# Patient Record
Sex: Male | Born: 1970 | Race: White | Hispanic: No | Marital: Married | Smoking: Former smoker
Health system: Southern US, Community
[De-identification: ages and names within clinical notes are randomized; demographics above are authoritative.]

## PROBLEM LIST (undated history)

## (undated) DIAGNOSIS — I10 Essential (primary) hypertension: Secondary | ICD-10-CM

## (undated) DIAGNOSIS — I214 Non-ST elevation (NSTEMI) myocardial infarction: Secondary | ICD-10-CM

## (undated) DIAGNOSIS — E785 Hyperlipidemia, unspecified: Secondary | ICD-10-CM

## (undated) DIAGNOSIS — F1010 Alcohol abuse, uncomplicated: Secondary | ICD-10-CM

## (undated) DIAGNOSIS — I2510 Atherosclerotic heart disease of native coronary artery without angina pectoris: Secondary | ICD-10-CM

## (undated) DIAGNOSIS — Z720 Tobacco use: Secondary | ICD-10-CM

## (undated) DIAGNOSIS — K219 Gastro-esophageal reflux disease without esophagitis: Secondary | ICD-10-CM

## (undated) HISTORY — DX: Hyperlipidemia, unspecified: E78.5

## (undated) HISTORY — DX: Essential (primary) hypertension: I10

## (undated) HISTORY — DX: Atherosclerotic heart disease of native coronary artery without angina pectoris: I25.10

## (undated) HISTORY — DX: Alcohol abuse, uncomplicated: F10.10

## (undated) HISTORY — PX: CORONARY STENT PLACEMENT: SHX1402

## (undated) HISTORY — PX: WISDOM TOOTH EXTRACTION: SHX21

## (undated) HISTORY — PX: TYMPANOSTOMY TUBE PLACEMENT: SHX32

## (undated) HISTORY — DX: Gastro-esophageal reflux disease without esophagitis: K21.9

---

## 2004-07-16 ENCOUNTER — Ambulatory Visit: Payer: Self-pay | Admitting: Family Medicine

## 2004-07-17 ENCOUNTER — Inpatient Hospital Stay (HOSPITAL_COMMUNITY): Admission: EM | Admit: 2004-07-17 | Discharge: 2004-07-19 | Payer: Self-pay | Admitting: Cardiovascular Disease

## 2004-07-17 ENCOUNTER — Ambulatory Visit: Payer: Self-pay | Admitting: Family Medicine

## 2004-07-17 ENCOUNTER — Ambulatory Visit: Payer: Self-pay | Admitting: Cardiovascular Disease

## 2004-07-23 ENCOUNTER — Ambulatory Visit: Payer: Self-pay | Admitting: Cardiology

## 2004-09-03 ENCOUNTER — Ambulatory Visit: Payer: Self-pay | Admitting: Cardiology

## 2004-10-07 ENCOUNTER — Ambulatory Visit: Payer: Self-pay | Admitting: Cardiology

## 2004-10-15 ENCOUNTER — Ambulatory Visit: Payer: Self-pay | Admitting: Cardiology

## 2009-07-31 ENCOUNTER — Encounter: Payer: Self-pay | Admitting: Adult Health

## 2009-08-02 ENCOUNTER — Encounter: Payer: Self-pay | Admitting: Adult Health

## 2009-08-08 ENCOUNTER — Ambulatory Visit: Payer: Self-pay | Admitting: Adult Health

## 2009-08-08 DIAGNOSIS — R1013 Epigastric pain: Secondary | ICD-10-CM | POA: Insufficient documentation

## 2009-08-08 DIAGNOSIS — G4730 Sleep apnea, unspecified: Secondary | ICD-10-CM | POA: Insufficient documentation

## 2009-08-10 ENCOUNTER — Encounter: Payer: Self-pay | Admitting: Cardiology

## 2009-08-10 ENCOUNTER — Ambulatory Visit (HOSPITAL_COMMUNITY): Admission: RE | Admit: 2009-08-10 | Discharge: 2009-08-10 | Payer: Self-pay | Admitting: Cardiology

## 2009-08-10 ENCOUNTER — Encounter (INDEPENDENT_AMBULATORY_CARE_PROVIDER_SITE_OTHER): Payer: Self-pay | Admitting: *Deleted

## 2009-08-10 ENCOUNTER — Ambulatory Visit: Payer: Self-pay | Admitting: Cardiology

## 2009-08-16 ENCOUNTER — Ambulatory Visit: Payer: Self-pay | Admitting: Cardiology

## 2009-08-16 DIAGNOSIS — R1084 Generalized abdominal pain: Secondary | ICD-10-CM | POA: Insufficient documentation

## 2009-09-13 ENCOUNTER — Encounter: Payer: Self-pay | Admitting: Adult Health

## 2010-09-06 ENCOUNTER — Encounter (INDEPENDENT_AMBULATORY_CARE_PROVIDER_SITE_OTHER): Payer: Self-pay | Admitting: *Deleted

## 2010-09-29 DIAGNOSIS — I214 Non-ST elevation (NSTEMI) myocardial infarction: Secondary | ICD-10-CM

## 2010-09-29 HISTORY — DX: Non-ST elevation (NSTEMI) myocardial infarction: I21.4

## 2010-10-29 NOTE — Letter (Signed)
Summary: Appointment - Reminder 2  [facility]. [address]   [place], [place] [address]   Phone: [phone]  Fax: [phone]     September 06, 2010 MRN: [id number]   Sean Rivas [address] [place], [place]  [address]   Dear [name],  Our records indicate that it is time to schedule a follow-up appointment.  KATHYRN LAWRENCE        recommended that you follow up with [place] in    11.2011 PAST DUE        . It is very important that we reach you to schedule this appointment. We look forward to participating in your health care needs. Please contact us at the number listed above at your earliest convenience to schedule your appointment.  If you are unable to make an appointment at this time, give [place] a call so we can update our records.     Sincerely,   Glass blower/designer

## 2011-02-14 NOTE — Cardiovascular Report (Signed)
Sean Rivas, Sean Rivas NO.:  [id number]   MEDICAL RECORD NO.:  [id number]          PATIENT TYPE:  INP   LOCATION:  6527                         FACILITY:  [facility]   PHYSICIAN:  Salvadore Farber, M.D. LHCDATE OF BIRTH:  09/19/1971   DATE OF PROCEDURE:  07/18/2004  DATE OF DISCHARGE:                              CARDIAC CATHETERIZATION   PROCEDURE:  Left heart catheterization, coronary angiography, left  ventriculography, drug eluting stent placement x 2 to the second obtuse  marginal.   INDICATIONS FOR PROCEDURE:  Sean Rivas is a 40 year old gentleman with no  prior history of cardiovascular disease.  Risk factors are markedly  positive, family history, ongoing tobacco abuse, and dyslipidemia.  He  presents with two weeks of chest discomfort occurring primarily with  exertion but culminating in rest pain for which he was admitted to [facility].  He ruled out for myocardial infarction by serial enzymes and  electrocardiograms.  He underwent Adenosine Cardiolite demonstrating  inferolateral ischemia with ejection fraction of 31%.  He was transferred  for diagnostic angiography with an eye to percutaneous revascularization.   PROCEDURE TECHNIQUE:  Informed consent was obtained.  Under 1% lidocaine  local anesthesia, a 6 French sheath was placed in the right femoral artery  using the modified Seldinger technique.  Diagnostic angiography and  ventriculography were performed using JL4, JR4, and pigtail catheters.  These images demonstrated serial severe stenoses within the first obtuse  marginal.  The decision was made for percutaneous intervention.   A double bolus Eptifibatide administered as was 600 mg oral Plavix.  Additional 50 mg per kg of heparin was given to achieve and maintain an ACT  of greater than 200 seconds.  A 6 French XP3.5 guide was advanced over the  wire and engaged in the ostium of the left main.  A Luge wire was advanced  to the distal  obtuse marginal without difficulty.  The more distal of the  two lesions was directly stented using a 2.5 by 13 mm Cypher deployed at 16  atmospheres.  The more proximal of the two lesions was then stented  using a  3 by 13 mm Cypher at 16 atmospheres.  The distal lesion was then post  dilated using a 3 by 12 mm Quantum at 18 atmospheres.  The proximal lesion  was then post dilated using a 3.5 by 12 mm Quantum Maverick at 16  atmospheres.  Final angiography demonstrated no residual stenosis in either  lesion and TIMI III flow to the distal vasculature.  The patient tolerated  the procedure well and was transferred to the holding room in stable  condition.   COMPLICATIONS:  None.   FINDINGS:  1.  Left ventricle:  108/18.  EF 60% without regional wall motion      abnormality.  2.  No aortic stenosis or mitral regurgitation.  3.  Left main:  Angiographically normal.  4.  LAD:  Moderate size vessel giving rise to two moderate size diagonal      branches.  There are mild luminal irregularities without significant      stenosis.  5.  Circumflex:  Moderate size vessel giving rise to a very high rising      first and large second diagonal branch.  The second diagonal had two      areas of 80% stenosis both treated with drug eluting stents to no      residual stenosis.  6.  Right coronary artery:  Moderate size dominant vessel.  There are minor      luminal irregularities along its course but no significant stenoses.   IMPRESSION/RECOMMENDATIONS:  Successful placement of two, non-overlapping,  drug eluting stents in the second obtuse marginal branch.  The patient will  be continued on Eptifibatide for 18 hours.  Plavix should be continued for  at least a year.  Full strength aspirin should be continued for at least a  year.  Smoking cessation is strongly advised.       WED/MEDQ  D:  07/18/2004  T:  07/18/2004  Job:  [id number]   cc:   Jonelle Sidle, M.D. [facility]   Selinda Flavin  [address]  [place]  [place] [address]  Fax: [phone]

## 2011-02-14 NOTE — Discharge Summary (Signed)
NAMEBERK, PILOT NO.:  [id number]   MEDICAL RECORD NO.:  [id number]          PATIENT TYPE:  INP   LOCATION:  6527                         FACILITY:  [facility]   PHYSICIAN:  Salvadore Farber, M.D. LHCDATE OF BIRTH:  April 18, 1971   DATE OF ADMISSION:  07/17/2004  DATE OF DISCHARGE:  07/18/2004                           DISCHARGE SUMMARY - REFERRING   DISCHARGE DIAGNOSES:  1.  Coronary artery disease, status post Sterling stent to the      circumflex/obtuse marginal.  2.  Tobacco abuse, smoking cessation counseling provided.  3.  Strong family history of early coronary artery disease.  4.  Hyperlipidemia, treated.   HOSPITAL COURSE:  Sean Rivas is a 40 year old male patient with a strong  family history of coronary artery disease.  His brother, who is only several  years older than this patient, also has known coronary artery disease and  has suffered a recent myocardial infarction.  He was admitted to [facility] with a 2-week history of constant burning in his left anterior  chest with occasional radiation to his left arm.  His troponins were  negative, but his CKs were elevated secondary to what was felt to be intense  weight-lifting workout.  EKG was essentially normal.  A stress Cardiolite  was performed and there was a lateral apical anteroseptal defect which was  felt to be ischemic in nature.  The patient was then transferred to [facility] for  cardiac catheterization on July 17, 2004.  He was found to have two 80%  sequential circumflex lesions and 2 drug-eluting stents were placed.  The  patient will need to remain on Plavix for greater than or equal to 1 year  and aspirin indefinitely.  He was counseled on smoking cessation and on  July 19, 2004, he was felt to be ready for discharge to home.   LABORATORY DATA:  Labs at that time revealed a BUN of 8, creatinine 1.1,  potassium 4; hemoglobin and hematocrit were normal.  His CK-MB was  negative.   DISCHARGE MEDICATIONS:  He is discharged to home on the following:  1.  Enteric-coated aspirin 325 mg a day.  2.  Plavix 75 mg a day.  3.  Wellbutrin 150 mg daily x3 days, then 1 p.o. b.i.d.  4.  Lipitor 80 mg p.o. nightly.  5.  Sublingual nitroglycerin p.r.n. chest pain.   ACTIVITY:  No straining and no lifting over 10 pounds for 1 week.  No  driving today; he may drive over the weekend.   DIET:  Remain on a low-fat diet.   WOUND CARE:  Clean over cath site with soap and water, no scrubbing.  Call  for questions or concerns at [phone].   FOLLOWUP:  He has an appointment with Suszanne Conners. Julious Oka. on August 02, 2004 at 11:30 a.m. at our [place] office.       LB/MEDQ  D:  07/19/2004  T:  07/19/2004  Job:  [id number]   cc:   [address], [place], [place] [address] [facility]   Selinda Flavin  [address]  [address], [address]  [place]  [place] [address]  Fax: [phone]

## 2011-06-19 ENCOUNTER — Encounter: Payer: Self-pay | Admitting: Adult Health

## 2011-06-19 ENCOUNTER — Ambulatory Visit (INDEPENDENT_AMBULATORY_CARE_PROVIDER_SITE_OTHER): Payer: 59 | Admitting: Adult Health

## 2011-06-19 DIAGNOSIS — I2510 Atherosclerotic heart disease of native coronary artery without angina pectoris: Secondary | ICD-10-CM

## 2011-06-19 DIAGNOSIS — R12 Heartburn: Secondary | ICD-10-CM

## 2011-06-19 DIAGNOSIS — I10 Essential (primary) hypertension: Secondary | ICD-10-CM

## 2011-06-19 MED ORDER — PANTOPRAZOLE SODIUM 40 MG PO TBEC: 40.0000 mg | DELAYED_RELEASE_TABLET | Freq: Two times a day (BID) | ORAL | Status: DC

## 2011-06-19 NOTE — Assessment & Plan Note (Signed)
He is complaining again of heartburn feelings that he experienced prior to stent placement in 2005.  He states that he is not having chest discomfort with exertion, but only at night when he lays down.  He has stopped taking PPI.  I have given him Rx for PPI 40 mg BID, he will have stress echo completed as well. He has a positive Murphy's sign and therefore will order GB ultrasound. Will follow-up for discussion after tests completed.

## 2011-06-19 NOTE — Patient Instructions (Signed)
**Note De-Identified Sean Rivas Obfuscation** Your physician has requested that you have a stress echocardiogram. For further information please visit [email or web address] Please follow instruction sheet as given.  Your physician recommends that you have a Gallbladder Ultrasound  Your physician recommends that you schedule a follow-up appointment in: after tests

## 2011-06-20 NOTE — Progress Notes (Signed)
HPI: Sean Rivas is a 40 y/o male patient of Dr. Dietrich Pates we are seeing after a 2 year hiatus for complaints of recurrent chest burning/heartburn symptoms.  He has a history of premature CAD at the age of 62, with DES to Cx and OM at that time, with symptoms of heartburn preceeding intervention.  He has had a follow-up stress scho in 2010 that was normal with no evidence of stress induced ischemia.    He is athletic, runs and lifts weights several times a week.  He has been experiencing recurrent heartburn at rest, but not with his exercise routine. He states it occurs a lot at night. He has become concerned because of the symptoms that lead to his PCI in 2007.  Otherwise he is doing well, without any associated complaints.  No Known Allergies  Current Outpatient Prescriptions  Medication Sig Dispense Refill  . aspirin 325 MG tablet Take 325 mg by mouth daily.        [name] gemfibrozil (LOPID) 600 MG tablet Take 600 mg by mouth 2 (two) times daily before a meal.        . lisinopril (PRINIVIL,ZESTRIL) 10 MG tablet Take 10 mg by mouth daily.        . simvastatin (ZOCOR) 80 MG tablet Take 80 mg by mouth at bedtime.        . nitroGLYCERIN (NITROLINGUAL) 0.4 MG/SPRAY spray Place 1 spray under the tongue every 5 (five) minutes as needed.        . pantoprazole (PROTONIX) 40 MG tablet Take 1 tablet (40 mg total) by mouth 2 (two) times daily.  60 tablet  3    Past Medical History  Diagnosis Date  . Coronary artery disease   . Hyperlipidemia   . Hypertension   . GERD (gastroesophageal reflux disease)     Past Surgical History  Procedure Date  . Cardiac catheterization 2005    DES to Circ and OM in 2005    ROS: PHYSICAL EXAM BP 125/88  Pulse 67  Resp 18  Ht 5\' 9"  (1.753 m)  Wt 206 lb 12.8 oz (93.804 kg)  BMI 30.54 kg/m2  SpO2 98% General: Well developed, well nourished, in no acute distress Head: Eyes PERRLA, No xanthomas.   Normal cephalic and atramatic  Lungs: Clear bilaterally to  auscultation and percussion. Heart: HRRR S1 S2, without MRG.  Pulses are 2+ & equal.            No carotid bruit. No JVD.  No abdominal bruits. No femoral bruits. Abdomen: Bowel sounds are positive, abdomen soft and non-tender without masses or                  Hernia's noted. Msk:  Back normal, normal gait. Normal strength and tone for age. Extremities: No clubbing, cyanosis or edema.  DP +1 Neuro: Alert and oriented X 3. Psych:  Good affect, responds appropriately    ASSESSMENT AND PLAN

## 2011-07-02 ENCOUNTER — Ambulatory Visit (HOSPITAL_COMMUNITY)
Admission: RE | Admit: 2011-07-02 | Discharge: 2011-07-02 | Disposition: A | Discharge status: Home / Self Care | Payer: 59 | Source: Ambulatory Visit | Attending: Adult Health | Admitting: Adult Health

## 2011-07-02 ENCOUNTER — Other Ambulatory Visit (HOSPITAL_COMMUNITY): Payer: 59

## 2011-07-02 DIAGNOSIS — R072 Precordial pain: Secondary | ICD-10-CM

## 2011-07-02 DIAGNOSIS — K7689 Other specified diseases of liver: Secondary | ICD-10-CM | POA: Insufficient documentation

## 2011-07-02 DIAGNOSIS — R12 Heartburn: Secondary | ICD-10-CM

## 2011-07-02 DIAGNOSIS — I2510 Atherosclerotic heart disease of native coronary artery without angina pectoris: Secondary | ICD-10-CM

## 2011-07-02 DIAGNOSIS — R079 Chest pain, unspecified: Secondary | ICD-10-CM | POA: Insufficient documentation

## 2011-07-02 DIAGNOSIS — R1013 Epigastric pain: Secondary | ICD-10-CM | POA: Insufficient documentation

## 2011-07-02 NOTE — Progress Notes (Signed)
*  PRELIMINARY RESULTS* Echocardiogram Echocardiogram Stress Test has been performed.  Sean Rivas 07/02/2011, 12:03 PM

## 2011-07-02 NOTE — Progress Notes (Signed)
Stress Lab Nurses Notes - [facility]  AMARU BURROUGHS 07/02/2011  Reason for doing test: CAD and Chest Pain  Type of test: Stress Echo  Nurse performing test: Parke Poisson, RN  Nuclear Medicine Tech: Not Applicable  Echo Tech: Karrie Doffing  MD performing test: R. Dietrich Pates  Family MD: Dimas Aguas  Test explained and consent signed: yes  IV started: No IV started  Symptoms: fatigue  Treatment/Intervention: None  Reason test stopped: fatigue and reached target HR  After recovery IV was: NA  Patient to return to Nuc. Med at : NA  Patient discharged: Home  Patient's Condition upon discharge was: stable  Comments: Peak BP during test 198/78 & HR 172. Recovery BP 138/80 & HR 85.  Symptoms resolved in recovery.  Erskine Speed T

## 2011-07-04 ENCOUNTER — Ambulatory Visit (INDEPENDENT_AMBULATORY_CARE_PROVIDER_SITE_OTHER): Payer: 59 | Admitting: Adult Health

## 2011-07-04 ENCOUNTER — Encounter: Payer: Self-pay | Admitting: Adult Health

## 2011-07-04 DIAGNOSIS — R1084 Generalized abdominal pain: Secondary | ICD-10-CM

## 2011-07-04 DIAGNOSIS — I2510 Atherosclerotic heart disease of native coronary artery without angina pectoris: Secondary | ICD-10-CM

## 2011-07-04 NOTE — Assessment & Plan Note (Signed)
Stress echo demonstrated normal.  No stress arrhythmias, or conduction abnormalities. There was no echocardiographic evidence for stress induced ischemia. This has been discussed with the patient with copy of this report given to him.  He is given reassurance.  If Dr. Dimas Aguas wishes to proceed with Testosterone injections, he is okay from cardiac standpoint.

## 2011-07-04 NOTE — Assessment & Plan Note (Signed)
Gallbladder ultrasound demonstrated normal gallbladder without stones, or dilation of the bile ducts. This was discussed with the patient and copies are provided for him to take to PCP.  He is advised to continue PPI (Dr. Gershon Cull) as directed as this has been helpful in alleviating symptoms.  HIDA scan can be completed at the discretion of PCP if he feels this is necessary.

## 2011-07-04 NOTE — Patient Instructions (Signed)
**Note De-identified Mckinzy Fuller Obfuscation** Your physician recommends that you continue on your current medications as directed. Please refer to the Current Medication list given to you today.  Your physician recommends that you schedule a follow-up appointment in: as needed  

## 2011-07-04 NOTE — Progress Notes (Signed)
HPI: [name] is a 40 y/o male patient of Dr. Dietrich Pates we are seeing after a 2 year hiatus for complaints of recurrent chest burning/heartburn symptoms.  He has a history of premature CAD at the age of 89, with DES to Cx and OM at that time, with symptoms of heartburn preceeding intervention.  He has had a follow-up stress scho in 2010 that was normal with no evidence of stress induced ischemia.    He is athletic, runs and lifts weights several times a week.  He has been experiencing recurrent heartburn at rest, but not with his exercise routine. He states it occurs a lot at night. He has become concerned because of the symptoms that lead to his PCI in 2007.  Otherwise he is doing well, without any associated complaints.  On last visit, I started him on PPI  BID and checked a gallbladder ultrasound and stress echo.  He is here for discussion of results. Since last visit he states after about a week on the PPI, symptoms have subsided for heart burn. No Known Allergies  Current Outpatient Prescriptions  Medication Sig Dispense Refill  . aspirin 325 MG tablet Take 325 mg by mouth daily.        [name] gemfibrozil (LOPID) 600 MG tablet Take 600 mg by mouth 2 (two) times daily before a meal.        . lisinopril (PRINIVIL,ZESTRIL) 10 MG tablet Take 10 mg by mouth daily.        . pantoprazole (PROTONIX) 40 MG tablet Take 1 tablet (40 mg total) by mouth 2 (two) times daily.  60 tablet  3  . simvastatin (ZOCOR) 80 MG tablet Take 80 mg by mouth at bedtime.        . nitroGLYCERIN (NITROLINGUAL) 0.4 MG/SPRAY spray Place 1 spray under the tongue every 5 (five) minutes as needed.          Past Medical History  Diagnosis Date  . Coronary artery disease   . Hyperlipidemia   . Hypertension   . GERD (gastroesophageal reflux disease)     Past Surgical History  Procedure Date  . Cardiac catheterization 2005    DES to Circ and OM in 2005    XBJ:YNWGNF of systems complete and found to be negative unless  listed above PHYSICAL EXAM BP 138/72  Pulse 66  Resp 18  Ht 5\' 9"  (1.753 m)  Wt 207 lb 14.4 oz (94.303 kg)  BMI 30.70 kg/m2  SpO2 98% General: Well developed, well nourished, in no acute distress Head: Eyes PERRLA, No xanthomas.   Normal cephalic and atramatic  Lungs: Clear bilaterally to auscultation and percussion. Heart: HRRR S1 S2, without MRG.  Pulses are 2+ & equal.            No carotid bruit. No JVD.  No abdominal bruits. No femoral bruits. Abdomen: Bowel sounds are positive, abdomen soft and non-tender without masses or                  Hernia's noted. Msk:  Back normal, normal gait. Normal strength and tone for age. Extremities: No clubbing, cyanosis or edema.  DP +1 Neuro: Alert and oriented X 3. Psych:  Good affect, responds appropriately    ASSESSMENT AND PLAN

## 2011-09-21 ENCOUNTER — Encounter (HOSPITAL_COMMUNITY): Payer: Self-pay | Admitting: Nurse Practitioner

## 2011-09-21 ENCOUNTER — Other Ambulatory Visit: Payer: Self-pay

## 2011-09-21 ENCOUNTER — Encounter (HOSPITAL_COMMUNITY)
Admission: EM | Disposition: A | Discharge status: Home / Self Care | Payer: Self-pay | Source: Ambulatory Visit | Attending: Cardiovascular Disease

## 2011-09-21 ENCOUNTER — Inpatient Hospital Stay (HOSPITAL_COMMUNITY)
Admission: EM | Admit: 2011-09-21 | Discharge: 2011-09-23 | DRG: 246 | Disposition: A | Discharge status: Home / Self Care | Payer: 59 | Source: Ambulatory Visit | Attending: Cardiovascular Disease | Admitting: Cardiovascular Disease

## 2011-09-21 DIAGNOSIS — I2510 Atherosclerotic heart disease of native coronary artery without angina pectoris: Secondary | ICD-10-CM

## 2011-09-21 DIAGNOSIS — F1010 Alcohol abuse, uncomplicated: Secondary | ICD-10-CM | POA: Diagnosis present

## 2011-09-21 DIAGNOSIS — F17200 Nicotine dependence, unspecified, uncomplicated: Secondary | ICD-10-CM | POA: Diagnosis present

## 2011-09-21 DIAGNOSIS — I214 Non-ST elevation (NSTEMI) myocardial infarction: Secondary | ICD-10-CM | POA: Diagnosis present

## 2011-09-21 DIAGNOSIS — Z720 Tobacco use: Secondary | ICD-10-CM | POA: Insufficient documentation

## 2011-09-21 DIAGNOSIS — Y831 Surgical operation with implant of artificial internal device as the cause of abnormal reaction of the patient, or of later complication, without mention of misadventure at the time of the procedure: Secondary | ICD-10-CM | POA: Diagnosis present

## 2011-09-21 DIAGNOSIS — Z23 Encounter for immunization: Secondary | ICD-10-CM

## 2011-09-21 DIAGNOSIS — K219 Gastro-esophageal reflux disease without esophagitis: Secondary | ICD-10-CM | POA: Insufficient documentation

## 2011-09-21 DIAGNOSIS — I498 Other specified cardiac arrhythmias: Secondary | ICD-10-CM | POA: Diagnosis not present

## 2011-09-21 DIAGNOSIS — Z7902 Long term (current) use of antithrombotics/antiplatelets: Secondary | ICD-10-CM

## 2011-09-21 DIAGNOSIS — I10 Essential (primary) hypertension: Secondary | ICD-10-CM | POA: Insufficient documentation

## 2011-09-21 DIAGNOSIS — Z7982 Long term (current) use of aspirin: Secondary | ICD-10-CM

## 2011-09-21 DIAGNOSIS — T82897A Other specified complication of cardiac prosthetic devices, implants and grafts, initial encounter: Principal | ICD-10-CM | POA: Diagnosis present

## 2011-09-21 DIAGNOSIS — E785 Hyperlipidemia, unspecified: Secondary | ICD-10-CM | POA: Insufficient documentation

## 2011-09-21 DIAGNOSIS — I2119 ST elevation (STEMI) myocardial infarction involving other coronary artery of inferior wall: Secondary | ICD-10-CM

## 2011-09-21 HISTORY — PX: PERCUTANEOUS CORONARY STENT INTERVENTION (PCI-S): SHX5485

## 2011-09-21 HISTORY — PX: LEFT HEART CATHETERIZATION WITH CORONARY ANGIOGRAM: SHX5451

## 2011-09-21 HISTORY — DX: Tobacco use: Z72.0

## 2011-09-21 LAB — COMPREHENSIVE METABOLIC PANEL
ALT: 44 U/L (ref 0–53)
AST: 29 U/L (ref 0–37)
Albumin: 4 g/dL (ref 3.5–5.2)
Alkaline Phosphatase: 56 U/L (ref 39–117)
BUN: 12 mg/dL (ref 6–23)
CO2: 23 mEq/L (ref 19–32)
Calcium: 9.4 mg/dL (ref 8.4–10.5)
Chloride: 99 mEq/L (ref 96–112)
Creatinine, Ser: 0.91 mg/dL (ref 0.50–1.35)
GFR calc Af Amer: >90 mL/min (ref >90)
GFR calc non Af Amer: >90 mL/min (ref >90)
Glucose, Bld: 117 mg/dL — ABNORMAL HIGH (ref 70–99)
Potassium: 3.3 mEq/L — ABNORMAL LOW (ref 3.5–5.1)
Sodium: 134 mEq/L — ABNORMAL LOW (ref 135–145)
Total Bilirubin: 0.3 mg/dL (ref 0.3–1.2)
Total Protein: 6.8 g/dL (ref 6.0–8.3)

## 2011-09-21 LAB — CARDIAC PANEL(CRET KIN+CKTOT+MB+TROPI)
CK, MB: 3.3 ng/mL (ref 0.3–4.0)
CK, MB: 50.4 ng/mL (ref 0.3–4.0)
Relative Index: 1.4 (ref 0.0–2.5)
Relative Index: 7.3 — ABNORMAL HIGH (ref 0.0–2.5)
Total CK: 236 U/L — ABNORMAL HIGH (ref 7–232)
Total CK: 694 U/L — ABNORMAL HIGH (ref 7–232)
Troponin I: <0.3 ng/mL (ref <0.30)
Troponin I: 7.15 ng/mL (ref <0.30)

## 2011-09-21 LAB — CBC
HCT: 40.7 % (ref 39.0–52.0)
Hemoglobin: 14.3 g/dL (ref 13.0–17.0)
MCH: 30.2 pg (ref 26.0–34.0)
MCHC: 35.1 g/dL (ref 30.0–36.0)
MCV: 86 fL (ref 78.0–100.0)
Platelets: 245 10*3/uL (ref 150–400)
RBC: 4.73 MIL/uL (ref 4.22–5.81)
RDW: 12.6 % (ref 11.5–15.5)
WBC: 9.5 10*3/uL (ref 4.0–10.5)

## 2011-09-21 LAB — HEMOGLOBIN A1C
Hgb A1c MFr Bld: 5.6 % (ref <5.7)
Mean Plasma Glucose: 114 mg/dL (ref <117)

## 2011-09-21 LAB — LIPID PANEL
Cholesterol: 264 mg/dL — ABNORMAL HIGH (ref 0–200)
HDL: 31 mg/dL — ABNORMAL LOW (ref >39)
LDL Cholesterol: 170 mg/dL — ABNORMAL HIGH (ref 0–99)
Total CHOL/HDL Ratio: 8.5 RATIO
Triglycerides: 314 mg/dL — ABNORMAL HIGH (ref <150)
VLDL: 63 mg/dL — ABNORMAL HIGH (ref 0–40)

## 2011-09-21 LAB — APTT: aPTT: 140 seconds — ABNORMAL HIGH (ref 24–37)

## 2011-09-21 LAB — PROTIME-INR
INR: 4.77 — ABNORMAL HIGH (ref 0.00–1.49)
Prothrombin Time: 45.4 seconds — ABNORMAL HIGH (ref 11.6–15.2)

## 2011-09-21 SURGERY — LEFT HEART CATHETERIZATION WITH CORONARY ANGIOGRAM
Anesthesia: LOCAL

## 2011-09-21 MED ORDER — HEPARIN (PORCINE) IN NACL 2-0.9 UNIT/ML-% IJ SOLN
INTRAMUSCULAR | Status: AC
  Filled 2011-09-21: qty 2000

## 2011-09-21 MED ORDER — LISINOPRIL 20 MG PO TABS
20.0000 mg | ORAL_TABLET | Freq: Every day | ORAL | Status: DC
  Administered 2011-09-21 – 2011-09-23 (×3): 20 mg via ORAL
  Filled 2011-09-21 (×3): qty 1

## 2011-09-21 MED ORDER — ACETAMINOPHEN 325 MG PO TABS: 650.0000 mg | ORAL_TABLET | ORAL | Status: DC | PRN

## 2011-09-21 MED ORDER — TESTOSTERONE 50 MG/5GM (1%) TD GEL
5.0000 g | Freq: Every day | TRANSDERMAL | Status: DC
  Administered 2011-09-22: 5 g via TRANSDERMAL
  Filled 2011-09-21: qty 5

## 2011-09-21 MED ORDER — GEMFIBROZIL 600 MG PO TABS
600.0000 mg | ORAL_TABLET | Freq: Two times a day (BID) | ORAL | Status: DC
  Administered 2011-09-21 – 2011-09-23 (×4): 600 mg via ORAL
  Filled 2011-09-21 (×6): qty 1

## 2011-09-21 MED ORDER — ADULT MULTIVITAMIN W/MINERALS CH
1.0000 | ORAL_TABLET | Freq: Every day | ORAL | Status: DC
  Administered 2011-09-21 – 2011-09-23 (×3): 1 via ORAL
  Filled 2011-09-21 (×3): qty 1

## 2011-09-21 MED ORDER — SODIUM CHLORIDE 0.9 % IV SOLN: 250.0000 mL | INTRAVENOUS | Status: DC | PRN

## 2011-09-21 MED ORDER — SODIUM CHLORIDE 0.9 % IV SOLN: 0.2500 mg/kg/h | INTRAVENOUS | Status: AC

## 2011-09-21 MED ORDER — ONDANSETRON HCL 4 MG/2ML IJ SOLN: 4.0000 mg | Freq: Four times a day (QID) | INTRAMUSCULAR | Status: DC | PRN

## 2011-09-21 MED ORDER — FOLIC ACID 1 MG PO TABS
1.0000 mg | ORAL_TABLET | Freq: Every day | ORAL | Status: DC
  Administered 2011-09-21 – 2011-09-23 (×3): 1 mg via ORAL
  Filled 2011-09-21 (×4): qty 1

## 2011-09-21 MED ORDER — BIVALIRUDIN 250 MG IV SOLR
INTRAVENOUS | Status: AC
  Filled 2011-09-21: qty 250

## 2011-09-21 MED ORDER — NITROGLYCERIN 0.4 MG/SPRAY TL SOLN: 1.0000 | Status: DC | PRN

## 2011-09-21 MED ORDER — ROSUVASTATIN CALCIUM 40 MG PO TABS
40.0000 mg | ORAL_TABLET | Freq: Every day | ORAL | Status: DC
  Administered 2011-09-21 – 2011-09-22 (×2): 40 mg via ORAL
  Filled 2011-09-21 (×3): qty 1

## 2011-09-21 MED ORDER — LIDOCAINE HCL (PF) 1 % IJ SOLN
INTRAMUSCULAR | Status: AC
  Filled 2011-09-21: qty 30

## 2011-09-21 MED ORDER — SODIUM CHLORIDE 0.9 % IV SOLN
INTRAVENOUS | Status: DC
  Administered 2011-09-21: 15:00:00 via INTRAVENOUS

## 2011-09-21 MED ORDER — NITROGLYCERIN 0.4 MG SL SUBL: 0.4000 mg | SUBLINGUAL_TABLET | SUBLINGUAL | Status: DC | PRN

## 2011-09-21 MED ORDER — ALPRAZOLAM 0.25 MG PO TABS: 0.2500 mg | ORAL_TABLET | Freq: Two times a day (BID) | ORAL | Status: DC | PRN

## 2011-09-21 MED ORDER — ATROPINE SULFATE 1 MG/ML IJ SOLN
INTRAMUSCULAR | Status: AC
  Filled 2011-09-21: qty 1

## 2011-09-21 MED ORDER — NITROGLYCERIN 0.2 MG/ML ON CALL CATH LAB
INTRAVENOUS | Status: AC
  Filled 2011-09-21: qty 1

## 2011-09-21 MED ORDER — SODIUM CHLORIDE 0.9 % IJ SOLN: 3.0000 mL | INTRAMUSCULAR | Status: DC | PRN

## 2011-09-21 MED ORDER — METOPROLOL TARTRATE 25 MG PO TABS
25.0000 mg | ORAL_TABLET | Freq: Two times a day (BID) | ORAL | Status: DC
  Administered 2011-09-21 – 2011-09-23 (×3): 25 mg via ORAL
  Filled 2011-09-21 (×5): qty 1

## 2011-09-21 MED ORDER — SODIUM CHLORIDE 0.9 % IJ SOLN
3.0000 mL | Freq: Two times a day (BID) | INTRAMUSCULAR | Status: DC
  Administered 2011-09-21 – 2011-09-23 (×4): 3 mL via INTRAVENOUS

## 2011-09-21 MED ORDER — PRASUGREL HCL 10 MG PO TABS
10.0000 mg | ORAL_TABLET | Freq: Every day | ORAL | Status: DC
  Administered 2011-09-22 – 2011-09-23 (×2): 10 mg via ORAL
  Filled 2011-09-21 (×2): qty 1

## 2011-09-21 MED ORDER — ASPIRIN EC 81 MG PO TBEC
81.0000 mg | DELAYED_RELEASE_TABLET | Freq: Every day | ORAL | Status: DC
  Administered 2011-09-22 – 2011-09-23 (×2): 81 mg via ORAL
  Filled 2011-09-21 (×2): qty 1

## 2011-09-21 MED ORDER — LORAZEPAM 1 MG PO TABS
1.0000 mg | ORAL_TABLET | Freq: Four times a day (QID) | ORAL | Status: DC | PRN
  Administered 2011-09-21 – 2011-09-22 (×2): 1 mg via ORAL
  Filled 2011-09-21 (×2): qty 1

## 2011-09-21 MED ORDER — MIDAZOLAM HCL 2 MG/2ML IJ SOLN
INTRAMUSCULAR | Status: AC
  Filled 2011-09-21: qty 2

## 2011-09-21 MED ORDER — PANTOPRAZOLE SODIUM 40 MG PO TBEC
40.0000 mg | DELAYED_RELEASE_TABLET | Freq: Two times a day (BID) | ORAL | Status: DC
  Administered 2011-09-21 – 2011-09-23 (×4): 40 mg via ORAL
  Filled 2011-09-21 (×5): qty 1

## 2011-09-21 MED ORDER — FENTANYL CITRATE 0.05 MG/ML IJ SOLN
INTRAMUSCULAR | Status: AC
  Filled 2011-09-21: qty 2

## 2011-09-21 MED ORDER — MORPHINE SULFATE 2 MG/ML IJ SOLN
2.0000 mg | INTRAMUSCULAR | Status: DC | PRN
  Administered 2011-09-21 (×3): 2 mg via INTRAVENOUS
  Filled 2011-09-21 (×3): qty 1

## 2011-09-21 MED ORDER — VITAMIN B-1 100 MG PO TABS
100.0000 mg | ORAL_TABLET | Freq: Every day | ORAL | Status: DC
  Administered 2011-09-21 – 2011-09-23 (×3): 100 mg via ORAL
  Filled 2011-09-21 (×3): qty 1

## 2011-09-21 MED ORDER — THIAMINE HCL 100 MG/ML IJ SOLN
100.0000 mg | Freq: Every day | INTRAMUSCULAR | Status: DC
  Filled 2011-09-21 (×2): qty 1

## 2011-09-21 MED ORDER — PRASUGREL HCL 10 MG PO TABS
ORAL_TABLET | ORAL | Status: AC
  Filled 2011-09-21: qty 6

## 2011-09-21 MED ORDER — ALLOPURINOL 100 MG PO TABS
100.0000 mg | ORAL_TABLET | Freq: Every day | ORAL | Status: DC
  Administered 2011-09-22 – 2011-09-23 (×2): 100 mg via ORAL
  Filled 2011-09-21 (×3): qty 1

## 2011-09-21 MED ORDER — PNEUMOCOCCAL VAC POLYVALENT 25 MCG/0.5ML IJ INJ
0.5000 mL | INJECTION | INTRAMUSCULAR | Status: AC
  Filled 2011-09-21: qty 0.5

## 2011-09-21 MED ORDER — ASPIRIN 81 MG PO CHEW: 81.0000 mg | CHEWABLE_TABLET | Freq: Every day | ORAL | Status: DC

## 2011-09-21 MED ORDER — ZOLPIDEM TARTRATE 5 MG PO TABS: 5.0000 mg | ORAL_TABLET | Freq: Every evening | ORAL | Status: DC | PRN

## 2011-09-21 MED ORDER — COLCHICINE 0.6 MG PO TABS
0.6000 mg | ORAL_TABLET | Freq: Every day | ORAL | Status: DC
Start: 2011-09-21 — End: 2011-09-23
  Administered 2011-09-22 – 2011-09-23 (×2): 0.6 mg via ORAL
  Filled 2011-09-21 (×3): qty 1

## 2011-09-21 MED ORDER — INFLUENZA VIRUS VACC SPLIT PF IM SUSP
0.5000 mL | INTRAMUSCULAR | Status: DC
  Filled 2011-09-21: qty 0.5

## 2011-09-21 MED ORDER — LORAZEPAM 2 MG/ML IJ SOLN
1.0000 mg | Freq: Four times a day (QID) | INTRAMUSCULAR | Status: DC | PRN
  Administered 2011-09-21 – 2011-09-22 (×2): 1 mg via INTRAVENOUS
  Filled 2011-09-21 (×2): qty 1

## 2011-09-21 NOTE — H&P (Signed)
I have seen and examined the patient. I agree with the above note. The patient presented with acute inferior myocardial infarction due to very late stent thrombosis. He underwent successful thrombectomy and drug-eluting stent placement. Post PCI care as outlined in my catheterization report.  Lorine Bears 09/21/2011 4:15 PM

## 2011-09-21 NOTE — Progress Notes (Signed)
09/21/11 1500  Clinical Encounter Type  Visited With Family;Patient;Patient and family together  Visit Type Spiritual support  Referral From Other (Comment) (STEMI team page)  Spiritual Encounters  Spiritual Needs Emotional  Stress Factors  Patient Stress Factors Major life changes (Needs to stop smoking)  Family Stress Factors Health changes    Chaplain's Note:  Responded to [facility] STEMI page.  Pt brought in via ems to cath lab.  Provided pastoral presence to staff and pt while pt being prepared for procedure.  Escorted pt wife and dtr from ED to cath waiting rm 3.  Provided pastoral presence and support to pt family.  Wife clearly concerned for husband, but received good care and support from family members.  Pt parents also arrived- mom is blind- and provided good comfort.  Checked on pt status for family. Escorted pt family to 2900 waiting after dr consult.  Pt wife and family thanked chaplain for presence and support.  Will follow-up as requested or needed.

## 2011-09-21 NOTE — Op Note (Addendum)
Cardiac Catheterization Procedure Note  Name: Sean Rivas MRN: [id number] DOB: December 30, 1970  Procedure: Left Heart Cath, Selective Coronary Angiography, LV angiography,  thrombectomy/drug-eluting Stent of OM 2.  Indication: Acute inferior myocardial infarction. This is a 40 year old man with known history of coronary artery disease status post angioplasty and Cypher drug-eluting stent placement of OM 2 in 2005. He had 2  nonoverlapping stents at that time. He has been doing reasonably well since then up until today when he started having chest pain while he was exercising which started around 11:30 this morning. EMS were called and ECG showed 2 mm of inferior ST elevation. The patient was transferred emergently for cardiac catheterization.   Medications:  Sedation:  2 mg IV Versed, 25 mcg IV Fentanyl    Diagnostic Procedure Details: The right groin was prepped, draped, and anesthetized with 1% lidocaine. Using the modified Seldinger technique, a 6 French sheath was introduced into the right femoral artery. Standard Judkins catheters were used for selective coronary angiography and left ventriculography. Catheter exchanges were performed over a wire.  The diagnostic procedure was well-tolerated without immediate complications.  Procedural Findings:  Hemodynamics: AO:  145/97   mmHg LV:  140/10    mmHg LVEDP: 17  mmHg  Coronary angiography: Coronary dominance: Right   Left Main:  The vessel is normal in size and free of significant disease.  Left Anterior Descending (LAD):  The vessel is normal in size without calcifications. There is tubular 30% stenosis in the midsegment. The rest of the vessel is free of significant disease.  1st diagonal (D1):  Large and branching. There is 30% disease proximally.  2nd diagonal (D2):  Small size and free of significant disease.  3rd diagonal (D3):  Very small in size.  Circumflex (LCx):  Normal in size and nondominant.  1st obtuse marginal:   Very small in size and free of significant disease.  2nd obtuse marginal:  Large size branch with a stent noted in the proximal segment which is patent with minimal restenosis. The area between the 2 stents is very hazy with possible plaque rupture. There is evidence of thrombosis at the proximal edge of the second stent. The rest of the vessel is free of significant disease.  3rd obtuse marginal:  Small in size and free of significant disease.   Ramus Intermedius:  Medium in size with 40-50% proximal disease.  Right Coronary Artery: Large in size and dominant. There is a 20% the tubular stenosis proximally. The midsegment has mild 20% disease. The rest of the vessel is free of significant disease.   posterior descending artery: Large in size and free of significant disease.  posterior lateral branch:  Normal in size and free of significant disease.  Left ventriculography: Left ventricular systolic function is normal , LVEF is estimated at 55 %, there is no significant mitral regurgitation  PCI Procedure Note:  Following the diagnostic procedure, the decision was made to proceed with PCI.  Weight-based bivalirudin was given for anticoagulation. Once a therapeutic ACT was achieved, a 6 [place] XB 3.5 guide catheter was inserted.  A intuition coronary guidewire was used to cross the lesion.  Thrombectomy was performed with 2 runs. White thrombus was retrieved.  The lesion was then stented with a 3.0 x 28 mm Promus drug-eluting stent.  The stent was postdilated with a 3.5 x 20 mm noncompliant balloon to 10 atmospheres distally and 12 atmospheres proximally..  Following PCI, there was 0% residual stenosis and TIMI-3 flow. Final angiography confirmed  an excellent result. The patient tolerated the PCI procedure well. There were no immediate procedural complications.  The patient was transferred to the post catheterization recovery area for further monitoring.  PCI Data: Vessel -OM 2/Segment - proximal and  mid Percent Stenosis (pre)  99% TIMI-flow 2 Stent 3.0 x 28 mm Promus drug-eluting stent postdilated with a 3.5 noncompliant balloon Percent Stenosis (post) 0 TIMI-flow (post) 3  Final Conclusions:  1. Acute inferior myocardial infarction likely due to very late stent thrombosis of the distal Cypher stent in OM 2 with significant disease in the gap between previous stents. Mild coronary artery disease otherwise elsewhere. 2. Normal LV systolic function and mildly elevated left ventricular end-diastolic pressure. 3. Successful thrombectomy and drug-eluting stent placement in OM 2. The stent basically partially covered the distal stent as well as the non-stented area between the 2 stents and also overlapped with the proximal stent.  Recommendations:  I recommend dual antiplatelet therapy indefinitely due to presentation with very late stent thrombosis. He should stay on Effient for at least one year which should be continued after that or switched to Plavix if needed. Aggressive treatment of risk factors recommended. Smoking cessation is strongly advised.  Lorine Bears MD, [facility] 09/21/2011, 2:37 PM

## 2011-09-21 NOTE — H&P (Signed)
Patient ID: Sean Rivas MRN: [id number], DOB/AGE: 1971/07/19   Admit date: 09/21/2011   Primary Physician: Criss Rosales, MD Primary Cardiologist: R. Rothbart  Pt. Profile:   41 y/o male w/ prior h/o CAD s/p DES x 2 to the OM2 in 2005 who presents with inf stemi and late stent thrombosis req thrombectomy and repeat stenting.  Problem List: Past Medical History  Diagnosis Date  . Coronary artery disease     A.  07/18/2004 - PCI/DES OM2: 2.5x13 Cypher DES Dist/3.0x13 Cypher Prox;   B.  06/2009 & 06/2011- NL Stress Echo EF 70-75%;  C. 09/21/2011 s/p STEMI 2/2 late stent thrombosis -  thrombectomy and PCI/DES 3.0 x 28 mm Promus  . Hyperlipidemia   . Hypertension   . GERD (gastroesophageal reflux disease)   . Tobacco abuse     24 pack year - 1ppd as of 09/21/2011     Allergies: No Known Allergies  HPI:   40 y/o male with the above problem list.  He is s/p DES of the OM2 in 2005.  This past fall, pt developed intermittent indigestion-like Ss and underwent Stress Echo showing NL EF and NL Wall motion - no evidence of ischemia.  Following this, he did well but a few weeks ago, while running on the treadmill, he developed sscp with diaph/dyspnea - took a ntg and Ss resolved within 15 mins.  He didn't think too much of it and has cont to exercise regularly.  This am, after running 3 miles on the treadmill, he went upstairs to shower, and while showering developed severe sscp/dyspnea/diaphoresis.  He took a NTG w/o relief then called EMS.  He was noted to have inferior ST elevation and a code Stemi was called.  Pt cont to c/o c/p upon arrival to [facility] and underwent emergent catheterization showing stent thrombosis w/in the previously placed OM2 stents.  Pt was treated w/ thrombectomy followed by DES as outlined above.  He is currently resting quietly in the CCU c/o minor 1/10 discomfort in his chest.   Home Medications Medications Prior to Admission  Medication Dose Route Frequency  Provider Last Rate Last Dose  . 0.9 %  sodium chloride infusion   Intravenous Continuous Iran Ouch, MD 100 mL/hr at 09/21/11 1524    . acetaminophen (TYLENOL) tablet 650 mg  650 mg Oral Q4H PRN Iran Ouch, MD      . aspirin chewable tablet 81 mg  81 mg Oral Daily Iran Ouch, MD      . bivalirudin (ANGIOMAX) 250 MG injection           . bivalirudin (ANGIOMAX) 5 mg/mL in sodium chloride 0.9 % 50 mL infusion  0.25 mg/kg/hr Intravenous Continuous Iran Ouch, MD 4.7 mL/hr at 09/21/11 1524 0.253 mg/kg/hr at 09/21/11 1524  . fentaNYL (SUBLIMAZE) 0.05 MG/ML injection           . gemfibrozil (LOPID) tablet 600 mg  600 mg Oral BID AC Iran Ouch, MD      . heparin 2-0.9 UNIT/ML-% infusion           . lidocaine (XYLOCAINE) 1 % injection           . lisinopril (PRINIVIL,ZESTRIL) tablet 20 mg  20 mg Oral Daily Iran Ouch, MD      . metoprolol tartrate (LOPRESSOR) tablet 25 mg  25 mg Oral BID Iran Ouch, MD      . midazolam (VERSED) 2 MG/2ML injection           .  morphine 2 MG/ML injection 2 mg  2 mg Intravenous Q1H PRN Iran Ouch, MD      . nitroGLYCERIN (NITROSTAT) SL tablet 0.4 mg  0.4 mg Sublingual Q5 Min x 3 PRN Iran Ouch, MD      . nitroGLYCERIN (NTG ON-CALL) 0.2 mg/mL injection           . ondansetron (ZOFRAN) injection 4 mg  4 mg Intravenous Q6H PRN Iran Ouch, MD      . pantoprazole (PROTONIX) EC tablet 40 mg  40 mg Oral BID WC Iran Ouch, MD      . prasugrel (EFFIENT) 10 MG tablet           . prasugrel (EFFIENT) tablet 10 mg  10 mg Oral Daily Iran Ouch, MD      . rosuvastatin (CRESTOR) tablet 40 mg  40 mg Oral q1800 Iran Ouch, MD      . zolpidem (AMBIEN) tablet 5 mg  5 mg Oral QHS PRN Iran Ouch, MD      . DISCONTD: nitroGLYCERIN (NITROLINGUAL) 0.4 MG/SPRAY spray 1 spray  1 spray Sublingual Q5 min PRN Iran Ouch, MD       Medications Prior to Admission  Medication Sig Dispense Refill  . gemfibrozil  (LOPID) 600 MG tablet Take 600 mg by mouth 2 (two) times daily before a meal.        . nitroGLYCERIN (NITROLINGUAL) 0.4 MG/SPRAY spray Place 1 spray under the tongue every 5 (five) minutes as needed.        . pantoprazole (PROTONIX) 40 MG tablet Take 1 tablet (40 mg total) by mouth 2 (two) times daily.  60 tablet  3     Family History  Problem Relation Age of Onset  . Coronary artery disease Brother 58    s/p stenting  . Heart failure Brother 36    s/p ICD  . Coronary artery disease Mother 14    deceased @ 33     History   Social History  . Marital Status: Married    Spouse Name: N/A    Number of Children: N/A  . Years of Education: N/A   Occupational History  . 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 Smoking status: Current Everyday Smoker -- 1.0 packs/day for 24 years    Types: Cigarettes  . Smokeless tobacco: Never Used  . Alcohol Use: 14.4 oz/week    24 Cans of beer per week  . Drug Use: No  . Sexually Active: Yes   Other Topics Concern  . Not on file   Social History Narrative   Lives in [place] with wife.  Exercises most days of the week - runs/lift weights.     Review of Systems: General: negative for chills, fever, night sweats or weight changes.  Cardiovascular: +++ for chest pain and dyspnea prompting admission.  No edema, orthopnea, palpitations, paroxysmal nocturnal dyspnea. Dermatological: negative for rash Respiratory: negative for cough or wheezing Urologic: negative for hematuria Abdominal: negative for nausea, vomiting, diarrhea, bright red blood per rectum, melena, or hematemesis Neurologic: negative for visual changes, syncope, or dizziness All other systems reviewed and are otherwise negative except as noted above.  Physical Exam: Blood pressure 147/95, pulse 62, temperature 98.7 F (37.1 C), temperature source Oral, resp. rate 12, height 5\' 9"  (1.753 m), weight 205 lb 0.4 oz (93 kg), SpO2 99.00%.  General: Well  developed, well nourished, in no acute distress. Head:  Normocephalic, atraumatic, sclera non-icteric, no xanthomas, nares are without discharge.  Neck: Supple without bruits or JVD. Lungs:  Resp regular and unlabored, CTA. Heart: RRR no s3, s4, or murmurs. Abdomen: Soft, non-tender, non-distended, BS + x 4.  Msk:  Strength and tone appears normal for age. Extremities: No clubbing, cyanosis or edema. DP/PT/Radials 2+ and equal bilaterally. Neuro: Alert and oriented X 3. Moves all extremities spontaneously. Psych: Normal affect.   Labs:   Results for orders placed during the hospital encounter of 09/21/11 (from the past 72 hour(s))  CBC     Status: Normal   Collection Time   09/21/11  1:58 PM      Component Value Range Comment   WBC 9.5  4.0 - 10.5 (K/uL)    RBC 4.73  4.22 - 5.81 (MIL/uL)    Hemoglobin 14.3  13.0 - 17.0 (g/dL)    HCT 40.9  81.1 - 91.4 (%)    MCV 86.0  78.0 - 100.0 (fL)    MCH 30.2  26.0 - 34.0 (pg)    MCHC 35.1  30.0 - 36.0 (g/dL)    RDW 78.2  95.6 - 21.3 (%)    Platelets 245  150 - 400 (K/uL)   COMPREHENSIVE METABOLIC PANEL     Status: Abnormal   Collection Time   09/21/11  1:58 PM      Component Value Range Comment   Sodium 134 (*) 135 - 145 (mEq/L)    Potassium 3.3 (*) 3.5 - 5.1 (mEq/L)    Chloride 99  96 - 112 (mEq/L)    CO2 23  19 - 32 (mEq/L)    Glucose, Bld 117 (*) 70 - 99 (mg/dL)    BUN 12  6 - 23 (mg/dL)    Creatinine, Ser 0.86  0.50 - 1.35 (mg/dL)    Calcium 9.4  8.4 - 10.5 (mg/dL)    Total Protein 6.8  6.0 - 8.3 (g/dL)    Albumin 4.0  3.5 - 5.2 (g/dL)    AST 29  0 - 37 (U/L)    ALT 44  0 - 53 (U/L)    Alkaline Phosphatase 56  39 - 117 (U/L)    Total Bilirubin 0.3  0.3 - 1.2 (mg/dL)    GFR calc non Af Amer >90  >90 (mL/min)    GFR calc Af Amer >90  >90 (mL/min)   LIPID PANEL     Status: Abnormal   Collection Time   09/21/11  1:58 PM      Component Value Range Comment   Cholesterol 264 (*) 0 - 200 (mg/dL)    Triglycerides 578 (*) <150  (mg/dL)    HDL 31 (*) >46 (mg/dL)    Total CHOL/HDL Ratio 8.5      VLDL 63 (*) 0 - 40 (mg/dL)    LDL Cholesterol 962 (*) 0 - 99 (mg/dL)   PROTIME-INR     Status: Abnormal   Collection Time   09/21/11  1:58 PM      Component Value Range Comment   Prothrombin Time 45.4 (*) 11.6 - 15.2 (seconds)    INR 4.77 (*) 0.00 - 1.49    APTT     Status: Abnormal   Collection Time   09/21/11  1:58 PM      Component Value Range Comment   aPTT 140 (*) 24 - 37 (seconds)   CARDIAC PANEL(CRET KIN+CKTOT+MB+TROPI)     Status: Abnormal   Collection Time   09/21/11  1:59 PM  Component Value Range Comment   Total CK 236 (*) 7 - 232 (U/L)    CK, MB 3.3  0.3 - 4.0 (ng/mL)    Troponin I <0.30  <0.30 (ng/mL)    Relative Index 1.4  0.0 - 2.5       Radiology/Studies: No results found.  EKG:  RSR, inf ST elevation.  ASSESSMENT AND PLAN:   1.  Acute Inferior STEMI/CAD/late stent thrombosis:  S/p emergent cath and PCI of OM2.  Currently c/o mild chest discomfort.  Will cycle CE.  Loaded with effient.  Cont asa.  BB, Statin added.  Eventual cardiac Rehab.  2.  Tob Abuse:  Cessation advised.  Will need formal counseling - will order.  3.  HL:  High dose statin added.  On Lopid @ home.  4. ETOH abuse:  Drinks at least a case of beer/week.  Will add prn ativan.  Watch for DT's.  Signed, Nicolasa Ducking, NP 09/21/2011, 3:48 PM

## 2011-09-21 NOTE — Progress Notes (Signed)
During shift change report was given at the bed side and day nurse had just finished holding right groin pressure post sheath removal and the sight looked good for about 30 seconds and then started to re bleed. Night nurse held pressure for 20 more minutes. Pressure held till 66. Pressure dressing applied and patient was given safety instructions for post sheath care. Wife is at bedside. Site has been checked every 15 minutes and continues to look normal. Right groin is a level 0.

## 2011-09-22 ENCOUNTER — Other Ambulatory Visit: Payer: Self-pay

## 2011-09-22 DIAGNOSIS — I214 Non-ST elevation (NSTEMI) myocardial infarction: Secondary | ICD-10-CM

## 2011-09-22 LAB — LIPID PANEL
Cholesterol: 246 mg/dL — ABNORMAL HIGH (ref 0–200)
HDL: 27 mg/dL — ABNORMAL LOW (ref >39)
LDL Cholesterol: UNDETERMINED mg/dL (ref 0–99)
Triglycerides: 425 mg/dL — ABNORMAL HIGH (ref <150)

## 2011-09-22 LAB — BASIC METABOLIC PANEL
BUN: 10 mg/dL (ref 6–23)
Chloride: 101 mEq/L (ref 96–112)
GFR calc Af Amer: >90 mL/min (ref >90)
Potassium: 3.7 mEq/L (ref 3.5–5.1)
Sodium: 134 mEq/L — ABNORMAL LOW (ref 135–145)

## 2011-09-22 LAB — POCT I-STAT 3, ART BLOOD GAS (G3+)
Acid-base deficit: 2 mmol/L (ref 0.0–2.0)
Bicarbonate: 22.4 mEq/L (ref 20.0–24.0)
O2 Saturation: 98 %
TCO2: 24 mmol/L (ref 0–100)

## 2011-09-22 LAB — CARDIAC PANEL(CRET KIN+CKTOT+MB+TROPI): Total CK: 1058 U/L — ABNORMAL HIGH (ref 7–232)

## 2011-09-22 LAB — CBC
HCT: 40.7 % (ref 39.0–52.0)
Hemoglobin: 13.7 g/dL (ref 13.0–17.0)
RDW: 12.9 % (ref 11.5–15.5)
WBC: 11.1 10*3/uL — ABNORMAL HIGH (ref 4.0–10.5)

## 2011-09-22 MED ORDER — INFLUENZA VIRUS VACC SPLIT PF IM SUSP
0.5000 mL | INTRAMUSCULAR | Status: AC
  Administered 2011-09-23: 0.5 mL via INTRAMUSCULAR
  Filled 2011-09-22: qty 0.5

## 2011-09-22 MED FILL — Dextrose Inj 5%: INTRAVENOUS | Qty: 50 | Status: AC

## 2011-09-22 NOTE — Progress Notes (Signed)
Patient ID: SEM MCCAUGHEY, male   DOB: 04/14/1971, 40 y.o.   MRN: [id number] Subjective:  No chest pain or sob.  Objective:  Vital Signs in the last 24 hours: Temp:  [97.5 F (36.4 C)-98.7 F (37.1 C)] 98.6 F (37 C) (12/24 0800) Pulse Rate:  [49-84] 65  (12/24 0934) Resp:  [11-24] 17  (12/24 0800) BP: (104-150)/(64-101) 127/64 mmHg (12/24 0900) SpO2:  [95 %-100 %] 96 % (12/24 0900) Weight:  [93 kg (205 lb 0.4 oz)-95 kg (209 lb 7 oz)] 209 lb 7 oz (95 kg) (12/24 0600)  Intake/Output from previous day: 12/23 0701 - 12/24 0700 In: 2440 [P.O.:480; I.V.:1960] Out: 3170 [Urine:3170] Intake/Output from this shift: Total I/O In: 360 [P.O.:360] Out: -   Physical Exam: Well appearing young man, NAD HEENT: Unremarkable Neck:  No JVD, no thyromegally Lymphatics:  No adenopathy Back:  No CVA tenderness Lungs:  Clear with no wheezes HEART:  Regular rate rhythm, no murmurs, no rubs, no clicks Abd:  Flat, positive bowel sounds, no organomegally, no rebound, no guarding Ext:  2 plus pulses, no edema, no cyanosis, no clubbing, no hematoma. Skin:  No rashes no nodules Neuro:  CN II through XII intact, motor grossly intact  Lab Results:  Basename 09/22/11 0002 09/21/11 1358  WBC 11.1* 9.5  HGB 13.7 14.3  PLT 246 245    Basename 09/22/11 0002 09/21/11 1358  NA 134* 134*  K 3.7 3.3*  CL 101 99  CO2 23 23  GLUCOSE 102* 117*  BUN 10 12  CREATININE 0.88 0.91    Basename 09/22/11 0010 09/21/11 2025  TROPONINI 11.02* 7.15*   Hepatic Function Panel  Basename 09/21/11 1358  PROT 6.8  ALBUMIN 4.0  AST 29  ALT 44  ALKPHOS 56  BILITOT 0.3  BILIDIR --  IBILI --    Basename 09/22/11 0002  CHOL 246*   No results found for this basename: PROTIME in the last 72 hours  Imaging: No results found.  Cardiac Studies: Tele - NSR with NSVT Assessment/Plan:  1. NSTEMI - He is s/p PCI. He is doing well. He will continue his current meds. If he remains uncomplicated, would  anticipate discharge on 12/25. 2. H/o ETOH abuse - will continue benzo's to help prevent DT's. 3.Tobacco abuse - will need smoking cessation consult.  LOS: 1 day    Lewayne Bunting 09/22/2011, 10:15 AM

## 2011-09-22 NOTE — Progress Notes (Signed)
Seen by Dr. Ladona Ridgel today. S/p PCI to very late stent thrombosis. Evaluated by myself. He denies chest pain, diaphoresis, shortness of breath, palpitations, lightheadedness, n/v and abdominal pain. Ambulating well in the hall with assistance. On ASA/Effient, BB, Statin, NTG; Ativan PRN due to history of alcohol abuse. Discharge anticipated for tomorrow per today's progress note. Stable, in good condition. Will transfer to telemetry. Plan to continue current meds, cardiac rehab and undergo tobacco cessation.   Jacqulyn Bath, PA-C 09/22/2011 4:08 PM

## 2011-09-22 NOTE — Discharge Summary (Signed)
Discharge Summary   Patient ID: Sean Rivas,  MRN: [id number], DOB/AGE: 04-Mar-1971 40 y.o.  Admit date: 09/21/2011 Discharge date: 09/22/2011  Discharge Diagnoses Principal Problem:  *ST elevation myocardial infarction (STEMI) of inferior wall Active Problems:  GERD (gastroesophageal reflux disease)  Hypertension  Hyperlipidemia  Coronary artery disease  Tobacco abuse   Allergies No Known Allergies  Procedures  Cardiac catheterization Procedural Findings:  Hemodynamics:  AO: 145/97 mmHg  LV: 140/10 mmHg  LVEDP: 17 mmHg  Coronary angiography:  Coronary dominance: Right  Left Main: The vessel is normal in size and free of significant disease.  Left Anterior Descending (LAD): The vessel is normal in size without calcifications. There is tubular 30% stenosis in the midsegment. The rest of the vessel is free of significant disease.  1st diagonal (D1): Large and branching. There is 30% disease proximally.  2nd diagonal (D2): Small size and free of significant disease.  3rd diagonal (D3): Very small in size.  Circumflex (LCx): Normal in size and nondominant.  1st obtuse marginal: Very small in size and free of significant disease.  2nd obtuse marginal: Large size branch with a stent noted in the proximal segment which is patent with minimal restenosis. The area between the 2 stents is very hazy with possible plaque rupture. There is evidence of thrombosis at the proximal edge of the second stent. The rest of the vessel is free of significant disease.  3rd obtuse marginal: Small in size and free of significant disease.  Ramus Intermedius: Medium in size with 40-50% proximal disease.  Right Coronary Artery: Large in size and dominant. There is a 20% the tubular stenosis proximally. The midsegment has mild 20% disease. The rest of the vessel is free of significant disease.  posterior descending artery: Large in size and free of significant disease.  posterior lateral branch:  Normal in size and free of significant disease. Left ventriculography: Left ventricular systolic function is normal , LVEF is estimated at 55 %, there is no significant mitral regurgitation  PCI Procedure Note: Following the diagnostic procedure, the decision was made to proceed with PCI. Weight-based bivalirudin was given for anticoagulation. Once a therapeutic ACT was achieved, a 6 [place] XB 3.5 guide catheter was inserted. A intuition coronary guidewire was used to cross the lesion. Thrombectomy was performed with 2 runs. White thrombus was retrieved. The lesion was then stented with a 3.0 x 28 mm Promus drug-eluting stent. The stent was postdilated with a 3.5 x 20 mm noncompliant balloon to 10 atmospheres distally and 12 atmospheres proximally.. Following PCI, there was 0% residual stenosis and TIMI-3 flow. Final angiography confirmed an excellent result. The patient tolerated the PCI procedure well. There were no immediate procedural complications. The patient was transferred to the post catheterization recovery area for further monitoring.  PCI Data:  Vessel -OM 2/Segment - proximal and mid  Percent Stenosis (pre) 99%  TIMI-flow 2  Stent 3.0 x 28 mm Promus drug-eluting stent postdilated with a 3.5 noncompliant balloon  Percent Stenosis (post) 0  TIMI-flow (post) 3  Final Conclusions:  1. Acute inferior myocardial infarction likely due to very late stent thrombosis of the distal Cypher stent in OM 2 with significant disease in the gap between previous stents. Mild coronary artery disease otherwise elsewhere.  2. Normal LV systolic function and mildly elevated left ventricular end-diastolic pressure.  3. Successful thrombectomy and drug-eluting stent placement in OM 2. The stent basically partially covered the distal stent as well as the non-stented area between the  2 stents and also overlapped with the proximal stent.  Recommendations:  I recommend dual antiplatelet therapy indefinitely due to  presentation with very late stent thrombosis. He should stay on Effient for at least one year which should be continued after that or switched to Plavix if needed. Aggressive treatment of risk factors recommended. Smoking cessation is strongly advised.  History of Present Illness  Sean Rivas 40 year old Caucasian male past medical history significant for CAD (status post DES x2 to distal and proximal OM 2, respectively in 2005), hypertension, hyperlipidemia, tobacco and alcohol abuse who underwent emergent PCI status post acute inferior STEMI on 10/23.  In the fall, patient developed what he thought to be indigestion-like symptoms and underwent stress echocardiogram revealing normal EF without wall motion abnormalities or evidence of ischemia 10/12. He was able to return to his usual activities and exercise with one episode of substernal chest pain relieved with NTG x1. He continued to exercise without incident. The morning of 12/23 after running on the treadmill he developed severe substernal chest pain with associated dyspnea and diaphoresis. NTG x1 provided no relief. He was noted to have inferior ST elevation and code STEMI was called.  Hospital Course   He was informed, consented, agreed and tolerated the procedure well which elicited the above findings including acute inferior myocardial infarction likely due to very late stent thrombosis of distal Cypher OM 2 stent with subsequent successful thrombectomy and DES placement to area; normal LV systolic function and mildly elevated LVEDP.  Today, he is relatively asymptomatic denying chest pain and shortness of breath. He has had no acute events overnight. Asymptomptomatic nonsustained ventricular tachycardia noted on telemetry. On 12/24, he was stable, in good condition and transferred to telemetry floor.  Of note, patient with history of alcohol abuse. He was started and continued on prophylactic Ativan PRN for the prevention of DT. Smoking  cessation consultation performed. He will continue on aspirin and Effient for at least one year; however, will need to be on DAPT (be it ASA/Effient or ASA/Plavix) indefinitely secondary to very late stent thrombosis.  Discharge Vitals:  Blood pressure 111/62, pulse 69, temperature 97.5 F (36.4 C), temperature source Oral, resp. rate 17, height 5\' 9"  (1.753 m), weight 95 kg (209 lb 7 oz), SpO2 97.00%.   Weight change:   Labs: Recent Labs  Basename 09/22/11 0002 09/21/11 1358   WBC 11.1* 9.5   HGB 13.7 14.3   HCT 40.7 40.7   MCV 87.9 86.0   PLT 246 245    Lab 09/22/11 0002 09/21/11 1358  NA 134* 134*  K 3.7 3.3*  CL 101 99  CO2 23 23  BUN 10 12  CREATININE 0.88 0.91  CALCIUM 9.1 9.4  PROT -- 6.8  BILITOT -- 0.3  ALKPHOS -- 56  ALT -- 44  AST -- 29  AMYLASE -- --  LIPASE -- --  GLUCOSE 102* 117*   Recent Labs  Basename 09/21/11 1358   HGBA1C 5.6   Recent Labs  Basename 09/22/11 0010 09/21/11 2025 09/21/11 1359   CKTOTAL 1058* 694* 236*   CKMB 81.4* 50.4* 3.3   CKMBINDEX -- -- --   TROPONINI 11.02* 7.15* <0.30   No components found with this basename: POCBNP Recent Labs  Basename 09/22/11 0002   CHOL 246*   HDL 27*   LDLCALC UNABLE TO CALCULATE IF TRIGLYCERIDE OVER 400 mg/dL   TRIG 578*   CHOLHDL 9.1   LDLDIRECT --    Disposition: stable, in good condition,  will be discharged home today   Discharge Medications:  Current Discharge Medication List    CONTINUE these medications which have NOT CHANGED   Details  allopurinol (ZYLOPRIM) 100 MG tablet Take 100 mg by mouth daily.      aspirin EC 81 MG tablet Take 81 mg by mouth daily.      colchicine 0.6 MG tablet Take 0.6 mg by mouth daily.      gemfibrozil (LOPID) 600 MG tablet Take 600 mg by mouth 2 (two) times daily before a meal.      lisinopril (PRINIVIL,ZESTRIL) 10 MG tablet Take 10 mg by mouth daily.      nitroGLYCERIN (NITROSTAT) 0.4 MG SL tablet Place 0.4 mg under the tongue every 5 (five)  minutes as needed. For chest pain     pantoprazole (PROTONIX) 40 MG tablet Take 1 tablet (40 mg total) by mouth 2 (two) times daily. Qty: 60 tablet, Refills: 3    simvastatin (ZOCOR) 80 MG tablet Take 80 mg by mouth at bedtime.      testosterone (ANDROGEL) 50 MG/5GM GEL Place 5 g onto the skin daily.        STOP taking these medications     aspirin 325 MG tablet Comments:  Reason for Stopping:       nitroGLYCERIN (NITROLINGUAL) 0.4 MG/SPRAY spray Comments:  Reason for Stopping:          Outstanding Labs/Studies: None  Duration of Discharge Encounter: Greater than 30 minutes including physician time.  Signed, R. Hurman Horn, PA-C 09/22/2011, 4:12 PM

## 2011-09-22 NOTE — Progress Notes (Signed)
CARDIAC REHAB PHASE I   PRE:  Rate/Rhythm: sr 60  BP:  Supine:   Sitting: 120/74  Standing:    SaO2: 96 RA  MODE:  Ambulation: 500 ft   POST:  Rate/Rhythem: SR 77  BP:  Supine:  Sitting: 108/74  Standing:    SaO2: 94 RA  Sean Rivas  Pt up to ambulate in hallway with minimal assist.  Pt tolerated well with no complaints of cp or sob.  Education completed at bedside in anticipation of d/c on 12/25.  Heart Attack booklet given to pt.

## 2011-09-23 DIAGNOSIS — I214 Non-ST elevation (NSTEMI) myocardial infarction: Secondary | ICD-10-CM

## 2011-09-23 LAB — BASIC METABOLIC PANEL
BUN: 13 mg/dL (ref 6–23)
Calcium: 9.5 mg/dL (ref 8.4–10.5)
Creatinine, Ser: 0.89 mg/dL (ref 0.50–1.35)
GFR calc Af Amer: >90 mL/min (ref >90)
GFR calc non Af Amer: >90 mL/min (ref >90)
Glucose, Bld: 99 mg/dL (ref 70–99)
Potassium: 4.2 mEq/L (ref 3.5–5.1)

## 2011-09-23 MED ORDER — PRASUGREL HCL 10 MG PO TABS: 10.0000 mg | ORAL_TABLET | Freq: Every day | ORAL | Status: DC

## 2011-09-23 MED ORDER — LISINOPRIL 20 MG PO TABS: 20.0000 mg | ORAL_TABLET | Freq: Every day | ORAL | Status: DC

## 2011-09-23 MED ORDER — METOPROLOL TARTRATE 25 MG PO TABS: 25.0000 mg | ORAL_TABLET | Freq: Two times a day (BID) | ORAL | Status: DC

## 2011-09-23 NOTE — Progress Notes (Signed)
Patient ID: Sean Rivas, male   DOB: 11/06/1970, 40 y.o.   MRN: [id number] Subjective:  No chest pain or sob.  Objective:  Vital Signs in the last 24 hours: Temp:  [97.5 F (36.4 C)-98.6 F (37 C)] 97.5 F (36.4 C) (12/25 0448) Pulse Rate:  [54-84] 56  (12/25 0448) Resp:  [17-19] 19  (12/25 0448) BP: (93-136)/(57-83) 116/77 mmHg (12/25 0448) SpO2:  [93 %-97 %] 96 % (12/25 0448) Weight:  [96.2 kg (212 lb 1.3 oz)] 212 lb 1.3 oz (96.2 kg) (12/25 0448)  Intake/Output from previous day: 12/24 0701 - 12/25 0700 In: 800 [P.O.:800] Out: -  Intake/Output from this shift:    Physical Exam: Well appearing NAD HEENT: Unremarkable Neck:  No JVD, no thyromegally Lymphatics:  No adenopathy Back:  No CVA tenderness Lungs:  Clear with no wheezes, rales, or rhonchi HEART:  Regular rate rhythm, no murmurs, no rubs, no clicks Abd:  Flat, positive bowel sounds, no organomegally, no rebound, no guarding Ext:  2 plus pulses, no edema, no cyanosis, no clubbing Skin:  No rashes no nodules Neuro:  CN II through XII intact, motor grossly intact  Lab Results:  Basename 09/22/11 0002 09/21/11 1358  WBC 11.1* 9.5  HGB 13.7 14.3  PLT 246 245    Basename 09/22/11 0002 09/21/11 1358  NA 134* 134*  K 3.7 3.3*  CL 101 99  CO2 23 23  GLUCOSE 102* 117*  BUN 10 12  CREATININE 0.88 0.91    Basename 09/22/11 0010 09/21/11 2025  TROPONINI 11.02* 7.15*   Hepatic Function Panel  Basename 09/21/11 1358  PROT 6.8  ALBUMIN 4.0  AST 29  ALT 44  ALKPHOS 56  BILITOT 0.3  BILIDIR --  IBILI --    Basename 09/22/11 0002  CHOL 246*   No results found for this basename: PROTIME in the last 72 hours  Imaging: No results found.  Cardiac Studies: Tele - NSR Assessment/Plan:  1. NSTEMI - he is day 2 s/p PCI/stent OM-1. No chest pain. He is stable for discharge. He will continue his current meds and require at least a year of Prasugrel.  2. Tobacco abuse - I discussed the importance of  smoking cessation as well as strategies for stopping. He notes that after his MI in 2005, he stopped smoking for 3 years and is determined to stop in the same way this time. 3. ETOH abuse - moderate (<2 drinks a day) or no ETOH consumption has been discussed. He has had no evidence of DT's. 4. Dyslipidemia - he will continue Rosuvastatin. 5. Disposition - ok for discharge today. Follow up Dr. Macarthur Critchley in [place] in 2 weeks.  LOS: 2 days    Lewayne Bunting 09/23/2011, 6:47 AM

## 2011-09-23 NOTE — Discharge Summary (Signed)
Addendum to the D/C summary done by Odella Aquas 09-22-2011.  On 09-23-2011, Sean Rivas was evaluated by Dr. Ladona Ridgel. He was ambulating without chest pain or shortness of breath. He had been started on a beta blocker after his MI and is on dual antiplatelet therapy as well. His ACE inhibitor had been increased. His statin was continued. Smoking cessation was discussed and reinforced. Dr Ladona Ridgel considered Mr. Krieger stable for discharge, to followup as an outpatient.   Discharge medications as listed on the discharge summary with the exception of: 1. lisinopril is increased from 10->20 mg daily.  2. Metoprolol 25 mg twice a day is new 3. Prasugrel 10 mg daily is new  A complete list of discharge medications and instructions is on the after-visit summary.

## 2011-10-01 ENCOUNTER — Telehealth: Payer: Self-pay | Admitting: Adult Health

## 2011-10-01 NOTE — Telephone Encounter (Signed)
Please advise./LV 

## 2011-10-01 NOTE — Telephone Encounter (Signed)
Yes, patient should stay out of work until he is seen. You may give him a note.

## 2011-10-01 NOTE — Telephone Encounter (Signed)
PT HAD HEART ATTACK ON 09/21/11 AND IS SCHEDULED FOR FOLLOW UP WITH KATHRYN ON 10/05/10. SHOULD HE STAY OUT OF WORK TILL THEN? PT STATES NOTHING WAS SAID ABOUT IT AT TIME OF DISCHARGE.  IF SO COULD WE GIVE HIM A LETTER STATING THIS.

## 2011-10-06 ENCOUNTER — Ambulatory Visit (INDEPENDENT_AMBULATORY_CARE_PROVIDER_SITE_OTHER): Payer: 59 | Admitting: Adult Health

## 2011-10-06 ENCOUNTER — Encounter: Payer: Self-pay | Admitting: Adult Health

## 2011-10-06 DIAGNOSIS — I10 Essential (primary) hypertension: Secondary | ICD-10-CM

## 2011-10-06 DIAGNOSIS — I2510 Atherosclerotic heart disease of native coronary artery without angina pectoris: Secondary | ICD-10-CM

## 2011-10-06 DIAGNOSIS — F17200 Nicotine dependence, unspecified, uncomplicated: Secondary | ICD-10-CM

## 2011-10-06 DIAGNOSIS — Z720 Tobacco use: Secondary | ICD-10-CM

## 2011-10-06 MED ORDER — LORAZEPAM 1 MG PO TABS: 1.0000 mg | ORAL_TABLET | Freq: Four times a day (QID) | ORAL | Status: AC | PRN

## 2011-10-06 NOTE — Assessment & Plan Note (Signed)
Cessation is discussed. See above.

## 2011-10-06 NOTE — Assessment & Plan Note (Addendum)
He is feeling better but worried about the possibility of a recurrent blood clot forming around his stents. I have advised him that the medication, Effient is a stronger anticoagulant and should be taken daily without fail. He is to stop smoking as this will activate platelet aggregation. We talked about the smoking cessation to include the increased risks of progression of CAD. He works at [employer] and finds it difficult to quit, but is using the electric cigarette as much as possible. I have advised him that we will follow him closely and assist him as much as we can to help him quit. He is requesting Ativan, as this helped him while in the hospital to not be edgy while without cigarettes. I have given him Rx for this. He is to return to work on Jan 9,2012. Letter has been provided for his employer.

## 2011-10-06 NOTE — Patient Instructions (Signed)
Your physician has recommended you make the following change in your medication: Resume Ativan 1 mg every 6 hrs. As needed  Your physician recommends that you schedule a follow-up appointment in: 3 months

## 2011-10-06 NOTE — Assessment & Plan Note (Signed)
Blood pressure is well controlled at present. No changes to his medications.

## 2011-10-06 NOTE — Progress Notes (Signed)
HPI:Sean Rivas is a 41 y/o patient of Dr.Rothbart we are seeing on follow-up after hospitalization for NSTEMI. He has a history of CAD with stents to the second OM. He had cardiac cath demonstrating the area between the 2 stents was very hazy with possible plaque rupture. There was evidence of thrombosis at the proximal edge of the second stent with 99% stenosis.Marland KitchenHe was treated with thrombectomy and PCI  using a Promus DES between the two existing stents, placed on DAPT. He unfortunately had continued to smoke prior to this event, with known CAD. I saw him in the office in October 2012 after a 2 year hiatus with complaints of recurrent chest discomfort. A stress echo was completed revealing no stress induced ischemia at that time.   Since discharge he has been without complaint but is "paranoid" about another blood clot in the area of his stents. He is still smoking but has cut down and is using an electric cigarette. He is complaint with his medications.   No Known Allergies  Current Outpatient Prescriptions  Medication Sig Dispense Refill  . allopurinol (ZYLOPRIM) 100 MG tablet Take 100 mg by mouth daily.        [name] aspirin EC 81 MG tablet Take 81 mg by mouth daily.        . colchicine 0.6 MG tablet Take 0.6 mg by mouth daily.        [name] gemfibrozil (LOPID) 600 MG tablet Take 600 mg by mouth 2 (two) times daily before a meal.        . lisinopril (PRINIVIL,ZESTRIL) 20 MG tablet Take 1 tablet (20 mg total) by mouth daily.  30 tablet  11  . metoprolol tartrate (LOPRESSOR) 25 MG tablet Take 1 tablet (25 mg total) by mouth 2 (two) times daily.  60 tablet  11  . nitroGLYCERIN (NITROSTAT) 0.4 MG SL tablet Place 0.4 mg under the tongue every 5 (five) minutes as needed. For chest pain       . pantoprazole (PROTONIX) 40 MG tablet Take 1 tablet (40 mg total) by mouth 2 (two) times daily.  60 tablet  3  . prasugrel (EFFIENT) 10 MG TABS Take 1 tablet (10 mg total) by mouth daily.  30 tablet  11  .  simvastatin (ZOCOR) 80 MG tablet Take 80 mg by mouth at bedtime.        [name] testosterone (ANDROGEL) 50 MG/5GM GEL Place 5 g onto the skin daily.        [name] LORazepam (ATIVAN) 1 MG tablet Take 1 tablet (1 mg total) by mouth every 6 (six) hours as needed for anxiety.  90 tablet  2    Past Medical History  Diagnosis Date  . Coronary artery disease     A.  07/18/2004 - PCI/DES OM2: 2.5x13 Cypher DES Dist/3.0x13 Cypher Prox;   B.  06/2009 & 06/2011- NL Stress Echo EF 70-75%;  C. 09/21/2011 s/p STEMI 2/2 late stent thrombosis -  thrombectomy and PCI/DES 3.0 x 28 mm Promus  . Hyperlipidemia   . Hypertension   . GERD (gastroesophageal reflux disease)   . Tobacco abuse     24 pack year - 1ppd as of 09/21/2011    No past surgical history on file.  WUJ:WJXBJY of systems complete and found to be negative unless listed above PHYSICAL EXAM BP 118/77  Pulse 52  Resp 18  Ht 5\' 9"  (1.753 m)  Wt 216 lb (97.977 kg)  BMI 31.90 kg/m2  General: Well developed, well  nourished, in no acute distress Head: Eyes PERRLA, No xanthomas.   Normal cephalic and atramatic  Lungs: Some scattered crackles are noted. Heart: HRRR S1 S2, without MRG.  Pulses are 2+ & equal.            No carotid bruit. No JVD.  No abdominal bruits. No femoral bruits. Abdomen: Bowel sounds are positive, abdomen soft and non-tender without masses or                  Hernia's noted. Msk:  Back normal, normal gait. Normal strength and tone for age. Extremities: No clubbing, cyanosis or edema.  DP +1 Neuro: Alert and oriented X 3. Psych:  Good affect, responds appropriately    ASSESSMENT AND PLAN

## 2011-10-16 ENCOUNTER — Encounter (HOSPITAL_COMMUNITY): Payer: Self-pay | Admitting: Cardiology

## 2011-12-25 ENCOUNTER — Encounter: Payer: Self-pay | Admitting: Adult Health

## 2011-12-25 ENCOUNTER — Ambulatory Visit (INDEPENDENT_AMBULATORY_CARE_PROVIDER_SITE_OTHER): Payer: 59 | Admitting: Adult Health

## 2011-12-25 DIAGNOSIS — I10 Essential (primary) hypertension: Secondary | ICD-10-CM

## 2011-12-25 DIAGNOSIS — I2510 Atherosclerotic heart disease of native coronary artery without angina pectoris: Secondary | ICD-10-CM

## 2011-12-25 DIAGNOSIS — F17200 Nicotine dependence, unspecified, uncomplicated: Secondary | ICD-10-CM

## 2011-12-25 DIAGNOSIS — E785 Hyperlipidemia, unspecified: Secondary | ICD-10-CM

## 2011-12-25 MED ORDER — METOPROLOL SUCCINATE ER 25 MG PO TB24: 25.0000 mg | ORAL_TABLET | Freq: Every day | ORAL | Status: DC

## 2011-12-25 MED ORDER — SILDENAFIL CITRATE 50 MG PO TABS: 50.0000 mg | ORAL_TABLET | Freq: Every day | ORAL | Status: DC | PRN

## 2011-12-25 NOTE — Progress Notes (Signed)
HPI: Sean Rivas is a very pleasant 41 y/o male we are following for CAD with NSTEMI. He has stents to second OM. He has had recent PCI between the two existing stents. He has been placed on Effient and has tolerating this without bleeding issues. He has had no complaints of chest pain or shortness of breath. He is having issues with ED. He has spoken with his PCP who is reluctant to give in Rx unless we are ok with it.  He has not needed to use NTG at anytime since most recent PCI. No Known Allergies  Current Outpatient Prescriptions  Medication Sig Dispense Refill  . allopurinol (ZYLOPRIM) 100 MG tablet Take 100 mg by mouth daily.        [name] aspirin EC 81 MG tablet Take 81 mg by mouth daily.        [name] gemfibrozil (LOPID) 600 MG tablet Take 600 mg by mouth 2 (two) times daily before a meal.        . lisinopril (PRINIVIL,ZESTRIL) 20 MG tablet Take 1 tablet (20 mg total) by mouth daily.  30 tablet  11  . LORazepam (ATIVAN) 1 MG tablet Take 1 mg by mouth every 8 (eight) hours as needed.      . metoprolol tartrate (LOPRESSOR) 25 MG tablet Take 1 tablet (25 mg total) by mouth 2 (two) times daily.  60 tablet  11  . nitroGLYCERIN (NITROSTAT) 0.4 MG SL tablet Place 0.4 mg under the tongue every 5 (five) minutes as needed. For chest pain       . pantoprazole (PROTONIX) 40 MG tablet Take 1 tablet (40 mg total) by mouth 2 (two) times daily.  60 tablet  3  . prasugrel (EFFIENT) 10 MG TABS Take 1 tablet (10 mg total) by mouth daily.  30 tablet  11  . simvastatin (ZOCOR) 80 MG tablet Take 80 mg by mouth at bedtime.        [name] testosterone (ANDROGEL) 50 MG/5GM GEL Place 5 g onto the skin daily.          Past Medical History  Diagnosis Date  . Coronary artery disease     A.  07/18/2004 - PCI/DES OM2: 2.5x13 Cypher DES Dist/3.0x13 Cypher Prox;   B.  06/2009 & 06/2011- NL Stress Echo EF 70-75%;  C. 09/21/2011 s/p STEMI 2/2 late stent thrombosis -  thrombectomy and PCI/DES 3.0 x 28 mm Promus  . Hyperlipidemia     . Hypertension   . GERD (gastroesophageal reflux disease)   . Tobacco abuse     24 pack year - 1ppd as of 09/21/2011     NWG:NFAOZH of systems complete and found to be negative unless listed above PHYSICAL EXAM BP 112/64  Pulse 72  Resp 16  Ht 5\' 9"  (1.753 m)  Wt 214 lb (97.07 kg)  BMI 31.60 kg/m2  General: Well developed, well nourished, in no acute distress Head: Eyes PERRLA, No xanthomas.   Normal cephalic and atramatic  Lungs: Clear bilaterally to auscultation and percussion. Heart: HRRR S1 S2, without MRG.  Pulses are 2+ & equal.            No carotid bruit. No JVD.  No abdominal bruits. No femoral bruits. Abdomen: Bowel sounds are positive, abdomen soft and non-tender without masses or                  Hernia's noted. Msk:  Back normal, normal gait. Normal strength and tone for age. Muscular from wt  lifting. Extremities: No clubbing, cyanosis or edema.  DP +1 Neuro: Alert and oriented X 3. Psych:  Good affect, responds appropriately :  ASSESSMENT AND PLAN

## 2011-12-25 NOTE — Assessment & Plan Note (Signed)
He has some complaints of myalgias after lifting weights that he did not experience prior to using statins, He is advised to stop the statin for one week and evaluate if this eliminates the myalgia.  If this does, he is to call us for possible change in medications.  He will have fasting lipids and LFT's in one month.

## 2011-12-25 NOTE — Assessment & Plan Note (Signed)
He has been without cardiac complaint Not needed to take NTG for any recurrence of chest discomfort.  He would like a Rx for Viagra. I have explained to him that it is safe to use if he has not taken a NTG. He is also warned that if he has chest pain while taking Viagra, he is not to take NTG. I have given him a Rx for this with no refills. He will see his PCP for refills. He verbalizes understainding.  I will also decrease metoprolol to 25 mg XL daily from BID dosing, to assist with less side effects with longacting dose.

## 2011-12-25 NOTE — Patient Instructions (Signed)
**Note De-Identified Mallerie Blok Obfuscation** Your physician has recommended you make the following change in your medication: start taking Viagra 25 mg as needed (please refer to your primary care MD for refills) and start taking Metoprolol Tartrate 25 mg daily (stop taking Metoprolol Succinate)  Your physician recommends that you return for lab work in: 1 month  Your physician recommends that you schedule a follow-up appointment in: 6 months

## 2011-12-30 ENCOUNTER — Other Ambulatory Visit: Payer: Self-pay | Admitting: *Deleted

## 2011-12-30 MED ORDER — PANTOPRAZOLE SODIUM 40 MG PO TBEC: 40.0000 mg | DELAYED_RELEASE_TABLET | Freq: Two times a day (BID) | ORAL | Status: DC

## 2012-01-21 ENCOUNTER — Other Ambulatory Visit: Payer: Self-pay | Admitting: Adult Health

## 2012-01-21 LAB — BASIC METABOLIC PANEL
BUN: 20 mg/dL (ref 6–23)
Chloride: 108 mEq/L (ref 96–112)
Creat: 1.1 mg/dL (ref 0.50–1.35)
Glucose, Bld: 91 mg/dL (ref 70–99)

## 2012-01-21 LAB — LIPID PANEL
Cholesterol: 240 mg/dL — ABNORMAL HIGH (ref 0–200)
LDL Cholesterol: 150 mg/dL — ABNORMAL HIGH (ref 0–99)
Triglycerides: 210 mg/dL — ABNORMAL HIGH (ref <150)

## 2012-01-21 LAB — HEPATIC FUNCTION PANEL
Alkaline Phosphatase: 54 U/L (ref 39–117)
Bilirubin, Direct: 0.1 mg/dL (ref 0.0–0.3)
Indirect Bilirubin: 0.4 mg/dL (ref 0.0–0.9)
Total Protein: 7.4 g/dL (ref 6.0–8.3)

## 2012-04-23 ENCOUNTER — Other Ambulatory Visit: Payer: Self-pay | Admitting: Cardiology

## 2012-04-23 MED ORDER — PANTOPRAZOLE SODIUM 40 MG PO TBEC: 40.0000 mg | DELAYED_RELEASE_TABLET | Freq: Two times a day (BID) | ORAL | Status: DC

## 2012-04-26 ENCOUNTER — Other Ambulatory Visit: Payer: Self-pay | Admitting: *Deleted

## 2012-04-26 MED ORDER — PANTOPRAZOLE SODIUM 40 MG PO TBEC: 40.0000 mg | DELAYED_RELEASE_TABLET | Freq: Two times a day (BID) | ORAL | Status: DC

## 2012-06-03 ENCOUNTER — Other Ambulatory Visit: Payer: Self-pay | Admitting: Adult Health

## 2012-06-03 MED ORDER — LORAZEPAM 1 MG PO TABS: 1.0000 mg | ORAL_TABLET | Freq: Three times a day (TID) | ORAL | Status: DC | PRN

## 2012-06-03 NOTE — Telephone Encounter (Signed)
PT STATES PHARMACY HAS FAXED THIS THREE TIMES

## 2012-06-22 ENCOUNTER — Encounter: Payer: Self-pay | Admitting: Endocrinology

## 2012-06-22 ENCOUNTER — Ambulatory Visit (INDEPENDENT_AMBULATORY_CARE_PROVIDER_SITE_OTHER): Payer: 59 | Admitting: Endocrinology

## 2012-06-22 VITALS — BP 124/72 | HR 76 | Temp 97.1°F | Wt 216.0 lb

## 2012-06-22 DIAGNOSIS — F1020 Alcohol dependence, uncomplicated: Secondary | ICD-10-CM | POA: Insufficient documentation

## 2012-06-22 DIAGNOSIS — E291 Testicular hypofunction: Secondary | ICD-10-CM | POA: Insufficient documentation

## 2012-06-22 NOTE — Patient Instructions (Addendum)
Try stopping the androgel. Please come back for a follow-up appointment for 1 month.  Please do blood tests a few days ahead of time.

## 2012-06-22 NOTE — Progress Notes (Signed)
Subjective:    Patient ID: Sean Rivas, male    DOB: 31-Dec-1970, 41 y.o.   MRN: [id number]  HPI Pt states 1 year of moderate erectile dysfunction, but no swelling of the legs.  No assoc decreased urinary stream.  He has been on several topical supplements, but testosterone level has stayed low on these.  Also, sxs have not improved.  He has 2 biological children--no h/o infertility.  He has never been on androgens other than what was prescribed for him.    Past Medical History  Diagnosis Date  . Coronary artery disease     A.  07/18/2004 - PCI/DES OM2: 2.5x13 Cypher DES Dist/3.0x13 Cypher Prox;   B.  06/2009 & 06/2011- NL Stress Echo EF 70-75%;  C. 09/21/2011 s/p STEMI 2/2 late stent thrombosis -  thrombectomy and PCI/DES 3.0 x 28 mm Promus  . Hyperlipidemia   . Hypertension   . GERD (gastroesophageal reflux disease)   . Tobacco abuse     24 pack year - 1ppd as of 09/21/2011    No past surgical history on file.  History   Social History  . Marital Status: Married    Spouse Name: N/A    Number of Children: N/A  . Years of Education: N/A   Occupational History  . 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 Smoking status: Current Every Day Smoker -- 1.0 packs/day for 24 years    Types: Cigarettes  . Smokeless tobacco: Never Used  . Alcohol Use: 14.4 oz/week    24 Cans of beer per week  . Drug Use: No  . Sexually Active: Yes   Other Topics Concern  . Not on file   Social History Narrative   Lives in [place] with wife.  Exercises most days of the week - runs/lift weights.    Current Outpatient Prescriptions on File Prior to Visit  Medication Sig Dispense Refill  . allopurinol (ZYLOPRIM) 100 MG tablet Take 100 mg by mouth daily.        [name] aspirin EC 81 MG tablet Take 81 mg by mouth daily.        [name] gemfibrozil (LOPID) 600 MG tablet Take 600 mg by mouth 2 (two) times daily before a meal.        . lisinopril (PRINIVIL,ZESTRIL) 20 MG tablet Take 1  tablet (20 mg total) by mouth daily.  30 tablet  11  . LORazepam (ATIVAN) 1 MG tablet Take 1 tablet (1 mg total) by mouth every 8 (eight) hours as needed.  45 tablet  0  . metoprolol succinate (TOPROL XL) 25 MG 24 hr tablet Take 1 tablet (25 mg total) by mouth daily.  30 tablet  6  . nitroGLYCERIN (NITROSTAT) 0.4 MG SL tablet Place 0.4 mg under the tongue every 5 (five) minutes as needed. For chest pain       . pantoprazole (PROTONIX) 40 MG tablet Take 1 tablet (40 mg total) by mouth 2 (two) times daily.  60 tablet  3  . prasugrel (EFFIENT) 10 MG TABS Take 1 tablet (10 mg total) by mouth daily.  30 tablet  11  . simvastatin (ZOCOR) 80 MG tablet Take 80 mg by mouth at bedtime.        [name] testosterone (ANDROGEL) 50 MG/5GM GEL Place 5 g onto the skin daily.        . sildenafil (VIAGRA) 50 MG tablet Take 1 tablet (50 mg total) by mouth daily as needed  for erectile dysfunction.  20 tablet  0    No Known Allergies  Family History  Problem Relation Age of Onset  . Coronary artery disease Brother 51    s/p stenting  . Heart failure Brother 36    s/p ICD  . Coronary artery disease Mother 87    deceased @ 33  no one with hypogonadism. BP 124/72  Pulse 76  Temp 97.1 F (36.2 C) (Oral)  Wt 216 lb (97.977 kg)  Review of Systems denies polyuria, depression, numbness, weight change, gynecomastia, fever, headache, sob, rash, blurry vision, rhinorrhea, chest pain.  He has myalgias, easy bruising, and fatigue.      Objective:   Physical Exam VS: see vs page GEN: no distress HEAD: head: no deformity eyes: no periorbital swelling, no proptosis external nose and ears are normal mouth: no lesion seen NECK: supple, thyroid is not enlarged CHEST WALL: no deformity LUNGS: clear to auscultation CV: reg rate and rhythm, no murmur. ABD: abdomen is soft, nontender.  no hepatosplenomegaly.  not distended.  no hernia GENITALIA:  Normal male.   MUSCULOSKELETAL: muscle bulk and strength are grossly normal.   no obvious joint swelling.  gait is normal and steady EXTEMITIES: no deformity. no edema PULSES: no carotid bruit NEURO:  cn 2-12 grossly intact.   readily moves all 4's.  sensation is intact to touch on the feet SKIN:  Normal texture and temperature.  No rash or suspicious lesion is visible.   NODES:  None palpable at the neck PSYCH: alert, oriented x3.  Does not appear anxious nor depressed.   (outside test results are reviewed)    Assessment & Plan:  Hypogonadism, uncertain etiology ED.  It is uncertain to what extent, if any, this is related to hypogonadism Fatigue, unrelated to hypogonadism

## 2012-07-19 ENCOUNTER — Other Ambulatory Visit (INDEPENDENT_AMBULATORY_CARE_PROVIDER_SITE_OTHER): Payer: 59

## 2012-07-19 ENCOUNTER — Telehealth: Payer: Self-pay | Admitting: General Practice

## 2012-07-19 DIAGNOSIS — E291 Testicular hypofunction: Secondary | ICD-10-CM

## 2012-07-19 LAB — PROLACTIN: Prolactin: 5.7 ng/mL (ref 2.1–17.1)

## 2012-07-19 NOTE — Telephone Encounter (Signed)
Pt states he has an appt on 10/24 to follow up with his testosterone. Can we place orders for him to go to [place] tomorrow morning to get his lab work completed for this? Please advise.

## 2012-07-19 NOTE — Telephone Encounter (Signed)
Disregard then.

## 2012-07-19 NOTE — Telephone Encounter (Signed)
Epic says was collected today.

## 2012-07-22 ENCOUNTER — Ambulatory Visit (INDEPENDENT_AMBULATORY_CARE_PROVIDER_SITE_OTHER): Payer: 59 | Admitting: Endocrinology

## 2012-07-22 ENCOUNTER — Encounter: Payer: Self-pay | Admitting: Endocrinology

## 2012-07-22 VITALS — BP 112/80 | HR 59 | Temp 97.6°F | Resp 16 | Wt 215.1 lb

## 2012-07-22 DIAGNOSIS — E291 Testicular hypofunction: Secondary | ICD-10-CM

## 2012-07-22 MED ORDER — CLOMIPHENE CITRATE 50 MG PO TABS: ORAL_TABLET | ORAL | Status: DC

## 2012-07-22 NOTE — Progress Notes (Signed)
Subjective:    Patient ID: Sean Rivas, male    DOB: April 13, 1971, 41 y.o.   MRN: [id number]  HPI Pt returns for f/u of hypogonadism.  He has decreased libido since he stopped the androgel.   Past Medical History  Diagnosis Date  . Coronary artery disease     A.  07/18/2004 - PCI/DES OM2: 2.5x13 Cypher DES Dist/3.0x13 Cypher Prox;   B.  06/2009 & 06/2011- NL Stress Echo EF 70-75%;  C. 09/21/2011 s/p STEMI 2/2 late stent thrombosis -  thrombectomy and PCI/DES 3.0 x 28 mm Promus  . Hyperlipidemia   . Hypertension   . GERD (gastroesophageal reflux disease)   . Tobacco abuse     24 pack year - 1ppd as of 09/21/2011    No past surgical history on file.  History   Social History  . Marital Status: Married    Spouse Name: N/A    Number of Children: N/A  . Years of Education: N/A   Occupational History  . 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 Smoking status: Current Every Day Smoker -- 1.0 packs/day for 24 years    Types: Cigarettes  . Smokeless tobacco: Never Used  . Alcohol Use: 14.4 oz/week    24 Cans of beer per week  . Drug Use: No  . Sexually Active: Yes   Other Topics Concern  . Not on file   Social History Narrative   Lives in [place] with wife.  Exercises most days of the week - runs/lift weights.    Current Outpatient Prescriptions on File Prior to Visit  Medication Sig Dispense Refill  . allopurinol (ZYLOPRIM) 100 MG tablet Take 100 mg by mouth daily.        [name] aspirin EC 81 MG tablet Take 81 mg by mouth daily.        [name] gemfibrozil (LOPID) 600 MG tablet Take 600 mg by mouth 2 (two) times daily before a meal.        . lisinopril (PRINIVIL,ZESTRIL) 20 MG tablet Take 1 tablet (20 mg total) by mouth daily.  30 tablet  11  . LORazepam (ATIVAN) 1 MG tablet Take 1 tablet (1 mg total) by mouth every 8 (eight) hours as needed.  45 tablet  0  . metoprolol succinate (TOPROL XL) 25 MG 24 hr tablet Take 1 tablet (25 mg total) by mouth daily.   30 tablet  6  . nitroGLYCERIN (NITROSTAT) 0.4 MG SL tablet Place 0.4 mg under the tongue every 5 (five) minutes as needed. For chest pain       . pantoprazole (PROTONIX) 40 MG tablet Take 1 tablet (40 mg total) by mouth 2 (two) times daily.  60 tablet  3  . prasugrel (EFFIENT) 10 MG TABS Take 1 tablet (10 mg total) by mouth daily.  30 tablet  11  . simvastatin (ZOCOR) 80 MG tablet Take 80 mg by mouth at bedtime.        . sildenafil (VIAGRA) 50 MG tablet Take 1 tablet (50 mg total) by mouth daily as needed for erectile dysfunction.  20 tablet  0    No Known Allergies  Family History  Problem Relation Age of Onset  . Coronary artery disease Brother 88    s/p stenting  . Heart failure Brother 36    s/p ICD  . Coronary artery disease Mother 28    deceased @ 50    BP 112/80  Pulse 59  Temp  97.6 F (36.4 C) (Oral)  Resp 16  Wt 215 lb 1 oz (97.552 kg)  SpO2 96%  Review of Systems No decreased urinary stream    Objective:   Physical Exam VITAL SIGNS:  See vs page GENERAL: no distress Ext: no edema  Lab Results  Component Value Date   TESTOSTERONE 177.66* 07/19/2012      Assessment & Plan:  Idiopathic central hypogonadism.  He can have a trial of clomid

## 2012-07-22 NOTE — Patient Instructions (Addendum)
i have sent a prescription to [facility], for a pill to help the testosterone. Please redo the blood tests in 4-6 weeks. Please come back for a follow-up appointment in 6 months.     normalization of testosterone is not known to harm you.  however, there are "theoretical" risks, including increased fertility, hair loss, prostate cancer, benign prostate enlargement, blood clots, liver problems, lower hdl ("good cholesterol"), sleep apnea, and behavior changes.

## 2012-08-31 ENCOUNTER — Other Ambulatory Visit: Payer: Self-pay | Admitting: Endocrinology

## 2012-08-31 ENCOUNTER — Encounter: Payer: Self-pay | Admitting: Family Medicine

## 2012-09-01 ENCOUNTER — Other Ambulatory Visit: Payer: Self-pay | Admitting: Endocrinology

## 2012-09-01 DIAGNOSIS — E291 Testicular hypofunction: Secondary | ICD-10-CM

## 2012-09-01 LAB — PROLACTIN: Prolactin: 4.7 ng/mL (ref 2.1–17.1)

## 2012-09-02 ENCOUNTER — Telehealth: Payer: Self-pay | Admitting: Endocrinology

## 2012-09-02 NOTE — Telephone Encounter (Signed)
Received 1 page from [facility]. Sent to Dr. Everardo All. 09/02/12/sd

## 2012-09-13 ENCOUNTER — Other Ambulatory Visit: Payer: Self-pay | Admitting: Endocrinology

## 2012-09-13 ENCOUNTER — Telehealth: Payer: Self-pay | Admitting: Endocrinology

## 2012-09-13 DIAGNOSIS — E291 Testicular hypofunction: Secondary | ICD-10-CM

## 2012-09-13 LAB — TESTOSTERONE: Testosterone: 362.93 ng/dL (ref 300–890)

## 2012-09-13 NOTE — Telephone Encounter (Signed)
i ordered

## 2012-09-16 ENCOUNTER — Other Ambulatory Visit (HOSPITAL_COMMUNITY): Payer: Self-pay | Admitting: Physician Assistant

## 2012-09-16 ENCOUNTER — Other Ambulatory Visit: Payer: Self-pay | Admitting: *Deleted

## 2012-10-25 ENCOUNTER — Ambulatory Visit: Payer: 59 | Admitting: Adult Health

## 2012-11-13 ENCOUNTER — Other Ambulatory Visit: Payer: Self-pay

## 2012-12-14 ENCOUNTER — Other Ambulatory Visit: Payer: Self-pay | Admitting: Cardiology

## 2012-12-30 ENCOUNTER — Encounter: Payer: Self-pay | Admitting: Adult Health

## 2012-12-30 NOTE — Progress Notes (Signed)
HPI [name] is a very pleasant 42 year old male patient of Dr. Dietrich Pates we are following for ongoing assessment and treatment of CAD with history of non-ST elevated MI, since to the second OM. He is also had PCI between the 2 existing stents. He has been placed on Effient, and is tolerating this. He was last seen in the office one year ago and is here for annual followup.    He has had no hospital admissions. He had no new diagnoses. He did visit the ER once this year due to a bleeding pimple that was cauterized. He denies recurrence of chest pain. He has only had to take nitroglycerin twice since being seen last. Dr. Dimas Aguas follows him for all labs he is to see him again next month.     No Known Allergies  Current Outpatient Prescriptions  Medication Sig Dispense Refill  . allopurinol (ZYLOPRIM) 100 MG tablet Take 100 mg by mouth daily.        [name] aspirin EC 81 MG tablet Take 81 mg by mouth daily.        . clomiPHENE (CLOMID) 50 MG tablet 1/4 tab daily  15 tablet  1  . EFFIENT 10 MG TABS TAKE 1 TABLET EVERY DAY  30 tablet  3  . gemfibrozil (LOPID) 600 MG tablet Take 600 mg by mouth 2 (two) times daily before a meal.        . lisinopril (PRINIVIL,ZESTRIL) 20 MG tablet TAKE ONE TABLET BY MOUTH EVERY DAY  30 tablet  3  . LORazepam (ATIVAN) 1 MG tablet Take 1 tablet (1 mg total) by mouth every 8 (eight) hours as needed.  45 tablet  0  . metoprolol succinate (TOPROL XL) 25 MG 24 hr tablet Take 1 tablet (25 mg total) by mouth daily.  30 tablet  6  . nitroGLYCERIN (NITROSTAT) 0.4 MG SL tablet Place 0.4 mg under the tongue every 5 (five) minutes as needed. For chest pain       . pantoprazole (PROTONIX) 40 MG tablet TAKE 1 TABLET (40 MG TOTAL) BY MOUTH 2 (TWO) TIMES DAILY.  60 tablet  3  . sildenafil (VIAGRA) 50 MG tablet Take 1 tablet (50 mg total) by mouth daily as needed for erectile dysfunction.  20 tablet  0  . simvastatin (ZOCOR) 80 MG tablet Take 80 mg by mouth at bedtime.         No  current facility-administered medications for this visit.    Past Medical History  Diagnosis Date  . Coronary artery disease     A.  07/18/2004 - PCI/DES OM2: 2.5x13 Cypher DES Dist/3.0x13 Cypher Prox;   B.  06/2009 & 06/2011- NL Stress Echo EF 70-75%;  C. 09/21/2011 s/p STEMI 2/2 late stent thrombosis -  thrombectomy and PCI/DES 3.0 x 28 mm Promus  . Hyperlipidemia   . Hypertension   . GERD (gastroesophageal reflux disease)   . Tobacco abuse     24 pack year - 1ppd as of 09/21/2011    History reviewed. No pertinent past surgical history.  EAV:WUJWJX of systems complete and found to be negative unless listed above  PHYSICAL EXAM General: Well developed, well nourished, in no acute distress Head: Eyes PERRLA, No xanthomas.   Normal cephalic and atramatic  Lungs: Clear bilaterally to auscultation and percussion. Heart: HRRR S1 S2, without MRG.  Pulses are 2+ & equal.            No carotid bruit. No JVD.  No abdominal bruits.  No femoral bruits. Abdomen: Bowel sounds are positive, abdomen soft and non-tender without masses or                  Hernia's noted. Msk:  Back normal, normal gait. Normal strength and tone for age. Extremities: No clubbing, cyanosis or edema.  DP +1 Neuro: Alert and oriented X 3. Psych:  Good affect, responds appropriately  EKG: Normal sinus rhythm without evidence of ischemia or infarction rate of 73 beats per minute  ASSESSMENT AND PLAN

## 2012-12-31 ENCOUNTER — Encounter: Payer: Self-pay | Admitting: Adult Health

## 2012-12-31 ENCOUNTER — Ambulatory Visit (INDEPENDENT_AMBULATORY_CARE_PROVIDER_SITE_OTHER): Payer: 59 | Admitting: Adult Health

## 2012-12-31 VITALS — BP 120/79 | HR 76 | Ht 69.0 in | Wt 234.8 lb

## 2012-12-31 DIAGNOSIS — E785 Hyperlipidemia, unspecified: Secondary | ICD-10-CM

## 2012-12-31 DIAGNOSIS — I2510 Atherosclerotic heart disease of native coronary artery without angina pectoris: Secondary | ICD-10-CM

## 2012-12-31 DIAGNOSIS — I10 Essential (primary) hypertension: Secondary | ICD-10-CM

## 2012-12-31 MED ORDER — NITROGLYCERIN 0.4 MG SL SUBL: 0.4000 mg | SUBLINGUAL_TABLET | SUBLINGUAL | Status: DC | PRN

## 2012-12-31 MED ORDER — PRASUGREL HCL 10 MG PO TABS: 10.0000 mg | ORAL_TABLET | Freq: Every day | ORAL | Status: DC

## 2012-12-31 NOTE — Assessment & Plan Note (Signed)
Stable pain free and doing well from a cardiovascular standpoint. He is only had to take nitroglycerin a couple times over the last year. He states is probably more related to his paranoia about recurrence of MI with minimal chest pain at that time. He continues medically compliant. We will continue him on Effient , aspirin, beta blocker, and ACE inhibitor. Followup labs are due and he will be seen by his primary care physician within the next month for annual labs. Continue risk factor management. See him again in one year unless he is symptomatic.

## 2012-12-31 NOTE — Assessment & Plan Note (Signed)
Continue statin therapy and ongoing risk management with low cholesterol diet.

## 2012-12-31 NOTE — Assessment & Plan Note (Signed)
Excellent control of blood pressure on current medication regimen. No changes at this time. Labs for primary care.

## 2012-12-31 NOTE — Patient Instructions (Addendum)
Your physician recommends that you schedule a follow-up appointment in: 1 year  

## 2012-12-31 NOTE — Progress Notes (Deleted)
Name: Sean Rivas    DOB: 06/07/1971  Age: 42 y.o.  MR#: [id number]       PCP:  Selinda Flavin, MD      Insurance: Payor: Cleatrice Burke  Plan: [place] [employer]  Product Type: *No Product type*    CC:    Chief Complaint  Patient presents with  . Coronary Artery Disease  . Hypertension    VS Filed Vitals:   12/31/12 1111  BP: 120/79  Pulse: 76  Height: 5\' 9"  (1.753 m)  Weight: 234 lb 12 oz (106.482 kg)    Weights Current Weight  12/31/12 234 lb 12 oz (106.482 kg)  07/22/12 215 lb 1 oz (97.552 kg)  06/22/12 216 lb (97.977 kg)    Blood Pressure  BP Readings from Last 3 Encounters:  12/31/12 120/79  07/22/12 112/80  06/22/12 124/72     Admit date:  (Not on file) Last encounter with RMR:  10/25/2012   Allergy Review of patient's allergies indicates no known allergies.  Current Outpatient Prescriptions  Medication Sig Dispense Refill  . allopurinol (ZYLOPRIM) 100 MG tablet Take 100 mg by mouth daily.        [name] aspirin EC 81 MG tablet Take 81 mg by mouth daily.        . clomiPHENE (CLOMID) 50 MG tablet 1/4 tab daily  15 tablet  1  . EFFIENT 10 MG TABS TAKE 1 TABLET EVERY DAY  30 tablet  3  . gemfibrozil (LOPID) 600 MG tablet Take 600 mg by mouth 2 (two) times daily before a meal.        . lisinopril (PRINIVIL,ZESTRIL) 20 MG tablet TAKE ONE TABLET BY MOUTH EVERY DAY  30 tablet  3  . LORazepam (ATIVAN) 1 MG tablet Take 1 tablet (1 mg total) by mouth every 8 (eight) hours as needed.  45 tablet  0  . metoprolol succinate (TOPROL XL) 25 MG 24 hr tablet Take 1 tablet (25 mg total) by mouth daily.  30 tablet  6  . nitroGLYCERIN (NITROSTAT) 0.4 MG SL tablet Place 0.4 mg under the tongue every 5 (five) minutes as needed. For chest pain       . pantoprazole (PROTONIX) 40 MG tablet TAKE 1 TABLET (40 MG TOTAL) BY MOUTH 2 (TWO) TIMES DAILY.  60 tablet  3  . sildenafil (VIAGRA) 50 MG tablet Take 1 tablet (50 mg total) by mouth daily as needed for erectile dysfunction.  20 tablet  0   . simvastatin (ZOCOR) 80 MG tablet Take 80 mg by mouth at bedtime.         No current facility-administered medications for this visit.    Discontinued Meds:   There are no discontinued medications.  Patient Active Problem List  Diagnosis  . SLEEP APNEA  . ABDOMINAL PAIN-EPIGASTRIC  . ABDOMINAL PAIN-GENERALIZED  . GERD (gastroesophageal reflux disease)  . Hypertension  . Hyperlipidemia  . Coronary artery disease  . Tobacco abuse  . ST elevation myocardial infarction (STEMI) of inferior wall  . Alcoholism  . Hypogonadism male    LABS    Component Value Date/Time   NA 143 01/21/2012 0856   NA 135 09/23/2011 0548   NA 134* 09/22/2011 0002   K 4.6 01/21/2012 0856   K 4.2 09/23/2011 0548   K 3.7 09/22/2011 0002   CL 108 01/21/2012 0856   CL 102 09/23/2011 0548   CL 101 09/22/2011 0002   CO2 25 01/21/2012 0856   CO2 22 09/23/2011 0548  CO2 23 09/22/2011 0002   GLUCOSE 91 01/21/2012 0856   GLUCOSE 99 09/23/2011 0548   GLUCOSE 102* 09/22/2011 0002   BUN 20 01/21/2012 0856   BUN 13 09/23/2011 0548   BUN 10 09/22/2011 0002   CREATININE 1.10 01/21/2012 0856   CREATININE 0.89 09/23/2011 0548   CREATININE 0.88 09/22/2011 0002   CREATININE 0.91 09/21/2011 1358   CALCIUM 9.8 01/21/2012 0856   CALCIUM 9.5 09/23/2011 0548   CALCIUM 9.1 09/22/2011 0002   GFRNONAA >90 09/23/2011 0548   GFRNONAA >90 09/22/2011 0002   GFRNONAA >90 09/21/2011 1358   GFRAA >90 09/23/2011 0548   GFRAA >90 09/22/2011 0002   GFRAA >90 09/21/2011 1358   CMP     Component Value Date/Time   NA 143 01/21/2012 0856   K 4.6 01/21/2012 0856   CL 108 01/21/2012 0856   CO2 25 01/21/2012 0856   GLUCOSE 91 01/21/2012 0856   BUN 20 01/21/2012 0856   CREATININE 1.10 01/21/2012 0856   CREATININE 0.89 09/23/2011 0548   CALCIUM 9.8 01/21/2012 0856   PROT 7.4 01/21/2012 0856   ALBUMIN 5.1 01/21/2012 0856   AST 25 01/21/2012 0856   ALT 26 01/21/2012 0856   ALKPHOS 54 01/21/2012 0856   BILITOT 0.5 01/21/2012 0856    GFRNONAA >90 09/23/2011 0548   GFRAA >90 09/23/2011 0548       Component Value Date/Time   WBC 11.1* 09/22/2011 0002   WBC 9.5 09/21/2011 1358   HGB 13.7 09/22/2011 0002   HGB 14.3 09/21/2011 1358   HCT 40.7 09/22/2011 0002   HCT 40.7 09/21/2011 1358   MCV 87.9 09/22/2011 0002   MCV 86.0 09/21/2011 1358    Lipid Panel     Component Value Date/Time   CHOL 240* 01/21/2012 0856   TRIG 210* 01/21/2012 0856   HDL 48 01/21/2012 0856   CHOLHDL 5.0 01/21/2012 0856   VLDL 42* 01/21/2012 0856   LDLCALC 150* 01/21/2012 0856    ABG    Component Value Date/Time   PHART 7.399 09/21/2011 1355   PCO2ART 36.4 09/21/2011 1355   PO2ART 111.0* 09/21/2011 1355   HCO3 22.4 09/21/2011 1355   TCO2 24 09/21/2011 1355   ACIDBASEDEF 2.0 09/21/2011 1355   O2SAT 98.0 09/21/2011 1355     Lab Results  Component Value Date   TSH 2.18 07/19/2012   BNP (last 3 results) No results found for this basename: PROBNP,  in the last 8760 hours Cardiac Panel (last 3 results) No results found for this basename: CKTOTAL, CKMB, TROPONINI, RELINDX,  in the last 72 hours  Iron/TIBC/Ferritin No results found for this basename: iron, tibc, ferritin     EKG Orders placed in visit on 12/31/12  . EKG 12-LEAD     Prior Assessment and Plan Problem List as of 12/31/2012     ICD-9-CM   SLEEP APNEA   ABDOMINAL PAIN-EPIGASTRIC   ABDOMINAL PAIN-GENERALIZED   Last Assessment & Plan   07/04/2011 Office Visit Written 07/04/2011  1:15 PM by Jodelle Gross, NP     Gallbladder ultrasound demonstrated normal gallbladder without stones, or dilation of the bile ducts. This was discussed with the patient and copies are provided for him to take to PCP.  He is advised to continue PPI (Dr. Gershon Cull) as directed as this has been helpful in alleviating symptoms.  HIDA scan can be completed at the discretion of PCP if he feels this is necessary.    GERD (gastroesophageal reflux disease)   Hypertension  Last Assessment & Plan    10/06/2011 Office Visit Written 10/06/2011  3:50 PM by Jodelle Gross, NP     Blood pressure is well controlled at present. No changes to his medications.    Hyperlipidemia   Last Assessment & Plan   12/25/2011 Office Visit Written 12/25/2011 11:36 AM by Jodelle Gross, NP     He has some complaints of myalgias after lifting weights that he did not experience prior to using statins, He is advised to stop the statin for one week and evaluate if this eliminates the myalgia.  If this does, he is to call us for possible change in medications.  He will have fasting lipids and LFT's in one month.     Coronary artery disease   Last Assessment & Plan   12/25/2011 Office Visit Written 12/25/2011 11:35 AM by Jodelle Gross, NP     He has been without cardiac complaint Not needed to take NTG for any recurrence of chest discomfort.  He would like a Rx for Viagra. I have explained to him that it is safe to use if he has not taken a NTG. He is also warned that if he has chest pain while taking Viagra, he is not to take NTG. I have given him a Rx for this with no refills. He will see his PCP for refills. He verbalizes understainding.  I will also decrease metoprolol to 25 mg XL daily from BID dosing, to assist with less side effects with longacting dose.    Tobacco abuse   Last Assessment & Plan   10/06/2011 Office Visit Written 10/06/2011  3:50 PM by Jodelle Gross, NP     Cessation is discussed. See above.    ST elevation myocardial infarction (STEMI) of inferior wall   Alcoholism   Hypogonadism male       Imaging: No results found.

## 2013-01-20 ENCOUNTER — Other Ambulatory Visit: Payer: Self-pay

## 2013-01-20 MED ORDER — CLOMIPHENE CITRATE 50 MG PO TABS: ORAL_TABLET | ORAL | Status: DC

## 2013-03-20 ENCOUNTER — Other Ambulatory Visit (HOSPITAL_COMMUNITY): Payer: Self-pay | Admitting: Adult Health

## 2013-03-21 NOTE — Telephone Encounter (Signed)
Medication sent via escribe.  

## 2013-04-13 ENCOUNTER — Other Ambulatory Visit: Payer: Self-pay | Admitting: Cardiology

## 2013-04-13 ENCOUNTER — Other Ambulatory Visit: Payer: Self-pay | Admitting: *Deleted

## 2013-04-13 NOTE — Telephone Encounter (Signed)
Medication sent via escribe for pantoprazole.

## 2013-07-09 ENCOUNTER — Other Ambulatory Visit: Payer: Self-pay | Admitting: Endocrinology

## 2013-07-14 ENCOUNTER — Other Ambulatory Visit: Payer: Self-pay | Admitting: Endocrinology

## 2013-08-02 ENCOUNTER — Emergency Department (HOSPITAL_COMMUNITY)
Admission: EM | Admit: 2013-08-02 | Discharge: 2013-08-03 | Disposition: A | Discharge status: Home / Self Care | Payer: 59 | Attending: Emergency Medicine | Admitting: Emergency Medicine

## 2013-08-02 ENCOUNTER — Encounter (HOSPITAL_COMMUNITY): Payer: Self-pay | Admitting: Emergency Medicine

## 2013-08-02 DIAGNOSIS — Z79899 Other long term (current) drug therapy: Secondary | ICD-10-CM | POA: Insufficient documentation

## 2013-08-02 DIAGNOSIS — Z9861 Coronary angioplasty status: Secondary | ICD-10-CM | POA: Insufficient documentation

## 2013-08-02 DIAGNOSIS — Z7982 Long term (current) use of aspirin: Secondary | ICD-10-CM | POA: Insufficient documentation

## 2013-08-02 DIAGNOSIS — I10 Essential (primary) hypertension: Secondary | ICD-10-CM | POA: Insufficient documentation

## 2013-08-02 DIAGNOSIS — E785 Hyperlipidemia, unspecified: Secondary | ICD-10-CM | POA: Insufficient documentation

## 2013-08-02 DIAGNOSIS — I2510 Atherosclerotic heart disease of native coronary artery without angina pectoris: Secondary | ICD-10-CM | POA: Insufficient documentation

## 2013-08-02 DIAGNOSIS — F17200 Nicotine dependence, unspecified, uncomplicated: Secondary | ICD-10-CM | POA: Insufficient documentation

## 2013-08-02 DIAGNOSIS — Z8719 Personal history of other diseases of the digestive system: Secondary | ICD-10-CM | POA: Insufficient documentation

## 2013-08-02 DIAGNOSIS — R079 Chest pain, unspecified: Secondary | ICD-10-CM | POA: Insufficient documentation

## 2013-08-02 NOTE — ED Notes (Signed)
Pt. reports intermittent chest pain since Friday last week with slight SOB , no nausea or diaphoresis . Pt. stated history of CAD / coronary stent - his cardiologist is with [facility].

## 2013-08-03 ENCOUNTER — Emergency Department (HOSPITAL_COMMUNITY): Payer: 59

## 2013-08-03 LAB — POCT I-STAT TROPONIN I

## 2013-08-03 LAB — CBC
Hemoglobin: 14.9 g/dL (ref 13.0–17.0)
MCH: 30.8 pg (ref 26.0–34.0)
MCHC: 35.5 g/dL (ref 30.0–36.0)
MCV: 86.8 fL (ref 78.0–100.0)
Platelets: 248 10*3/uL (ref 150–400)
RBC: 4.84 MIL/uL (ref 4.22–5.81)
RDW: 12.9 % (ref 11.5–15.5)
WBC: 6.9 10*3/uL (ref 4.0–10.5)

## 2013-08-03 LAB — BASIC METABOLIC PANEL
BUN: 15 mg/dL (ref 6–23)
CO2: 23 mEq/L (ref 19–32)
Calcium: 9.3 mg/dL (ref 8.4–10.5)
GFR calc Af Amer: >90 mL/min (ref >90)
Glucose, Bld: 107 mg/dL — ABNORMAL HIGH (ref 70–99)
Potassium: 4.3 mEq/L (ref 3.5–5.1)
Sodium: 141 mEq/L (ref 135–145)

## 2013-08-03 LAB — TROPONIN I: Troponin I: <0.3 ng/mL (ref <0.30)

## 2013-08-03 LAB — PRO B NATRIURETIC PEPTIDE: Pro B Natriuretic peptide (BNP): 61.1 pg/mL (ref 0–125)

## 2013-08-03 MED ORDER — ACETAMINOPHEN 325 MG PO TABS
650.0000 mg | ORAL_TABLET | Freq: Once | ORAL | Status: AC
  Administered 2013-08-03: 650 mg via ORAL
  Filled 2013-08-03: qty 2

## 2013-08-03 MED ORDER — GI COCKTAIL ~~LOC~~
30.0000 mL | Freq: Once | ORAL | Status: AC
  Administered 2013-08-03: 30 mL via ORAL
  Filled 2013-08-03: qty 30

## 2013-08-03 MED ORDER — FAMOTIDINE 20 MG PO TABS
20.0000 mg | ORAL_TABLET | Freq: Once | ORAL | Status: AC
  Administered 2013-08-03: 20 mg via ORAL
  Filled 2013-08-03: qty 1

## 2013-08-03 NOTE — ED Provider Notes (Addendum)
CSN: [id number]     Arrival date & time 08/02/13  2322 History   First MD Initiated Contact with Patient 08/03/13 0057     Chief Complaint  Patient presents with  . Chest Pain   (Consider location/radiation/quality/duration/timing/severity/associated sxs/prior Treatment) Patient is a 42 y.o. male presenting with chest pain. The history is provided by the patient.  Chest Pain Associated symptoms: no abdominal pain, no back pain, no cough, no dysphagia, no fever, no headache, no palpitations and no shortness of breath   pt c/o cp for the past 3-4 days. Episodes at rest. Located epigastric, xiphoid area. Dull. States at times last seconds, at other times last full day. Current symptoms for past day. No radiation, no neck, back or shoulder pain. No associated nv, diaphoresis or sob. Symptoms at rest. No relation to activity or exertion. No relation to eating. ?hx gerd. Denies hx gallstones or pud. Hx cad/stent. States unsure if like prior cardiac cp, states doesn't think so but he feels anxious at times. No recent immobility, travel, surgery or travel. No pleuritic pain. No cough or hemoptysis. No leg pain or swelling. No hx dvt or pe. Denies uri c/o. No fever or chills. No chest wall injury or strain.      Past Medical History  Diagnosis Date  . Coronary artery disease     A.  07/18/2004 - PCI/DES OM2: 2.5x13 Cypher DES Dist/3.0x13 Cypher Prox;   B.  06/2009 & 06/2011- NL Stress Echo EF 70-75%;  C. 09/21/2011 s/p STEMI 2/2 late stent thrombosis -  thrombectomy and PCI/DES 3.0 x 28 mm Promus  . Hyperlipidemia   . Hypertension   . GERD (gastroesophageal reflux disease)   . Tobacco abuse     24 pack year - 1ppd as of 09/21/2011   Past Surgical History  Procedure Laterality Date  . Coronary stent placement     Family History  Problem Relation Age of Onset  . Coronary artery disease Brother 35    s/p stenting  . Heart failure Brother 36    s/p ICD  . Coronary artery disease Mother 47     deceased @ 57   History  Substance Use Topics  . Smoking status: Current Every Day Smoker -- 1.00 packs/day for 24 years    Types: Cigarettes  . Smokeless tobacco: Never Used  . Alcohol Use: 14.4 oz/week    24 Cans of beer per week    Review of Systems  Constitutional: Negative for fever.  HENT: Negative for sore throat and trouble swallowing.   Eyes: Negative for redness.  Respiratory: Negative for cough and shortness of breath.   Cardiovascular: Positive for chest pain. Negative for palpitations and leg swelling.  Gastrointestinal: Negative for abdominal pain.  Genitourinary: Negative for flank pain.  Musculoskeletal: Negative for back pain and neck pain.  Skin: Negative for rash.  Neurological: Negative for headaches.  Hematological: Does not bruise/bleed easily.  Psychiatric/Behavioral: Negative for confusion.    Allergies  Review of patient's allergies indicates no known allergies.  Home Medications   Current Outpatient Rx  Name  Route  Sig  Dispense  Refill  . allopurinol (ZYLOPRIM) 100 MG tablet   Oral   Take 100 mg by mouth daily.           [name] aspirin EC 81 MG tablet   Oral   Take 81 mg by mouth daily.           . clomiPHENE (CLOMID) 50 MG tablet  TAKE 1/4 TABLET BY MOUTH EVERY DAY   15 tablet   0     WE SENT THIS ON MONDAY....Marland KitchenNO RESPONSE.....PLEASE  .[name].   . gemfibrozil (LOPID) 600 MG tablet   Oral   Take 600 mg by mouth 2 (two) times daily before a meal.           . lisinopril (PRINIVIL,ZESTRIL) 20 MG tablet      TAKE ONE TABLET BY MOUTH EVERY DAY   30 tablet   3   . LORazepam (ATIVAN) 1 MG tablet   Oral   Take 1 tablet (1 mg total) by mouth every 8 (eight) hours as needed.   45 tablet   0     Pt is due for 6 month follow up appointment per ch ...   . EXPIRED: metoprolol succinate (TOPROL XL) 25 MG 24 hr tablet   Oral   Take 1 tablet (25 mg total) by mouth daily.   30 tablet   6   . nitroGLYCERIN (NITROSTAT) 0.4 MG SL  tablet   Sublingual   Place 1 tablet (0.4 mg total) under the tongue every 5 (five) minutes as needed. For chest pain   90 tablet   3   . prasugrel (EFFIENT) 10 MG TABS   Oral   Take 1 tablet (10 mg total) by mouth daily.   30 tablet   11     Patient needs to call office and schedule 6 month  ...   . EXPIRED: sildenafil (VIAGRA) 50 MG tablet   Oral   Take 1 tablet (50 mg total) by mouth daily as needed for erectile dysfunction.   20 tablet   0     Refer to pcp for refills   . simvastatin (ZOCOR) 80 MG tablet   Oral   Take 80 mg by mouth at bedtime.            BP 120/69  Pulse 36  Temp(Src) 98.3 F (36.8 C) (Oral)  Resp 18  Wt 225 lb 9.6 oz (102.331 kg)  SpO2 96% Physical Exam  Nursing note and vitals reviewed. Constitutional: He is oriented to person, place, and time. He appears well-developed and well-nourished. No distress.  HENT:  Mouth/Throat: Oropharynx is clear and moist.  Eyes: Conjunctivae are normal. No scleral icterus.  Neck: Neck supple. No tracheal deviation present.  Cardiovascular: Normal rate, regular rhythm, normal heart sounds and intact distal pulses.  Exam reveals no gallop and no friction rub.   No murmur heard. Pulmonary/Chest: Effort normal and breath sounds normal. No accessory muscle usage. No respiratory distress. He exhibits no tenderness.  Abdominal: Soft. Bowel sounds are normal. He exhibits no distension and no mass. There is no tenderness. There is no rebound and no guarding.  Musculoskeletal: Normal range of motion. He exhibits no edema and no tenderness.  Neurological: He is alert and oriented to person, place, and time.  Skin: Skin is warm and dry.  Psychiatric: He has a normal mood and affect.    ED Course  Procedures (including critical care time)  Results for orders placed during the hospital encounter of 08/02/13  BASIC METABOLIC PANEL      Result Value Range   Sodium 141  135 - 145 mEq/L   Potassium 4.3  3.5 - 5.1 mEq/L    Chloride 107  96 - 112 mEq/L   CO2 23  19 - 32 mEq/L   Glucose, Bld 107 (*) 70 - 99 mg/dL   BUN 15  6 - 23 mg/dL   Creatinine, Ser 1.61  0.50 - 1.35 mg/dL   Calcium 9.3  8.4 - 09.6 mg/dL   GFR calc non Af Amer 86 (*) >90 mL/min   GFR calc Af Amer >90  >90 mL/min  CBC      Result Value Range   WBC 6.9  4.0 - 10.5 K/uL   RBC 4.84  4.22 - 5.81 MIL/uL   Hemoglobin 14.9  13.0 - 17.0 g/dL   HCT 04.5  40.9 - 81.1 %   MCV 86.8  78.0 - 100.0 fL   MCH 30.8  26.0 - 34.0 pg   MCHC 35.5  30.0 - 36.0 g/dL   RDW 91.4  78.2 - 95.6 %   Platelets 248  150 - 400 K/uL  PRO B NATRIURETIC PEPTIDE      Result Value Range   Pro B Natriuretic peptide (BNP) 61.1  0 - 125 pg/mL  TROPONIN I      Result Value Range   Troponin I <0.30  <0.30 ng/mL  POCT I-STAT TROPONIN I      Result Value Range   Troponin i, poc 0.00  0.00 - 0.08 ng/mL   Comment 3            Dg Chest 2 View  08/03/2013   CLINICAL DATA:  Chest pain, history of heart attack.  EXAM: CHEST  2 VIEW  COMPARISON:  Chest radiograph July 07, 2004  FINDINGS: Cardiomediastinal silhouette is nonsuspicious, Coronary artery stent in place. 2 mm right middle lobe granuloma. The lungs are clear without pleural effusions or focal consolidations. Pulmonary vasculature is unremarkable. Trachea projects midline and there is no pneumothorax. Soft tissue planes and included osseous structures are nonsuspicious.  IMPRESSION: No acute cardiopulmonary process.   Electronically Signed   By: Awilda Metro   On: 08/03/2013 00:49     EKG Interpretation     Ventricular Rate:  63 PR Interval:  148 QRS Duration: 90 QT Interval:  404 QTC Calculation: 413 R Axis:   5 Text Interpretation:  Normal sinus rhythm with sinus arrhythmia Normal ECG            MDM  Iv ns. Labs.  Pt notes pain atypical, and at times in epigastric, xiphoid area. At rest. ?hx gerd, in addition to noted hx cad.  Will try pepcid and gi cocktail for symptom relief.  ecg  normal, initial trop normal.  Reviewed nursing notes and prior charts for additional history.   After symptoms for 3-4 days, constant >24 hrs, trop x 2 normal/neg.  Recheck pt symptom free.  Pt appears stable for d/c.  Will have f/u w his cardiologist in the next 1-2 days.       Suzi Roots, MD 08/03/13 [phone]

## 2013-08-03 NOTE — ED Notes (Signed)
Patient transported to X-ray 

## 2013-08-03 NOTE — ED Notes (Signed)
Explained to the pt what was going on and he is ok with no complaints at this time

## 2013-08-04 ENCOUNTER — Other Ambulatory Visit: Payer: Self-pay | Admitting: Cardiology

## 2013-08-04 ENCOUNTER — Other Ambulatory Visit: Payer: Self-pay

## 2013-08-04 ENCOUNTER — Other Ambulatory Visit (HOSPITAL_COMMUNITY): Payer: Self-pay | Admitting: Adult Health

## 2013-08-05 ENCOUNTER — Inpatient Hospital Stay (HOSPITAL_COMMUNITY)
Admission: EM | Admit: 2013-08-05 | Discharge: 2013-08-08 | DRG: 897 | Disposition: A | Discharge status: Home / Self Care | Payer: 59 | Source: Intra-hospital | Attending: Psychiatry | Admitting: Psychiatry

## 2013-08-05 ENCOUNTER — Emergency Department (HOSPITAL_COMMUNITY)
Admission: EM | Admit: 2013-08-05 | Discharge: 2013-08-05 | Disposition: A | Discharge status: Other Acute Inpatient Hospital | Payer: 59 | Attending: Emergency Medicine | Admitting: Emergency Medicine

## 2013-08-05 ENCOUNTER — Encounter (HOSPITAL_COMMUNITY): Payer: Self-pay | Admitting: Emergency Medicine

## 2013-08-05 DIAGNOSIS — Z79899 Other long term (current) drug therapy: Secondary | ICD-10-CM | POA: Insufficient documentation

## 2013-08-05 DIAGNOSIS — I10 Essential (primary) hypertension: Secondary | ICD-10-CM | POA: Insufficient documentation

## 2013-08-05 DIAGNOSIS — R259 Unspecified abnormal involuntary movements: Secondary | ICD-10-CM | POA: Insufficient documentation

## 2013-08-05 DIAGNOSIS — I2510 Atherosclerotic heart disease of native coronary artery without angina pectoris: Secondary | ICD-10-CM | POA: Insufficient documentation

## 2013-08-05 DIAGNOSIS — F1020 Alcohol dependence, uncomplicated: Secondary | ICD-10-CM | POA: Diagnosis present

## 2013-08-05 DIAGNOSIS — F4310 Post-traumatic stress disorder, unspecified: Secondary | ICD-10-CM | POA: Diagnosis present

## 2013-08-05 DIAGNOSIS — E785 Hyperlipidemia, unspecified: Secondary | ICD-10-CM | POA: Insufficient documentation

## 2013-08-05 DIAGNOSIS — Z634 Disappearance and death of family member: Secondary | ICD-10-CM

## 2013-08-05 DIAGNOSIS — Z9861 Coronary angioplasty status: Secondary | ICD-10-CM | POA: Insufficient documentation

## 2013-08-05 DIAGNOSIS — Z7901 Long term (current) use of anticoagulants: Secondary | ICD-10-CM | POA: Insufficient documentation

## 2013-08-05 DIAGNOSIS — K219 Gastro-esophageal reflux disease without esophagitis: Secondary | ICD-10-CM | POA: Diagnosis present

## 2013-08-05 DIAGNOSIS — F1994 Other psychoactive substance use, unspecified with psychoactive substance-induced mood disorder: Secondary | ICD-10-CM | POA: Diagnosis present

## 2013-08-05 DIAGNOSIS — F17200 Nicotine dependence, unspecified, uncomplicated: Secondary | ICD-10-CM | POA: Insufficient documentation

## 2013-08-05 DIAGNOSIS — Z7982 Long term (current) use of aspirin: Secondary | ICD-10-CM | POA: Insufficient documentation

## 2013-08-05 DIAGNOSIS — F332 Major depressive disorder, recurrent severe without psychotic features: Secondary | ICD-10-CM | POA: Diagnosis present

## 2013-08-05 LAB — CBC WITH DIFFERENTIAL/PLATELET
Basophils Absolute: 0 10*3/uL (ref 0.0–0.1)
Basophils Relative: 0 % (ref 0–1)
Eosinophils Relative: 7 % — ABNORMAL HIGH (ref 0–5)
HCT: 43 % (ref 39.0–52.0)
Lymphocytes Relative: 30 % (ref 12–46)
MCH: 29.3 pg (ref 26.0–34.0)
MCHC: 34.2 g/dL (ref 30.0–36.0)
Monocytes Absolute: 0.4 10*3/uL (ref 0.1–1.0)
Neutro Abs: 3.9 10*3/uL (ref 1.7–7.7)
Neutrophils Relative %: 56 % (ref 43–77)
RDW: 12.8 % (ref 11.5–15.5)
WBC: 6.9 10*3/uL (ref 4.0–10.5)

## 2013-08-05 LAB — URINALYSIS, ROUTINE W REFLEX MICROSCOPIC
Bilirubin Urine: NEGATIVE
Glucose, UA: NEGATIVE mg/dL
Hgb urine dipstick: NEGATIVE
Ketones, ur: NEGATIVE mg/dL
Leukocytes, UA: NEGATIVE
Nitrite: NEGATIVE
Protein, ur: NEGATIVE mg/dL
pH: 7 (ref 5.0–8.0)

## 2013-08-05 LAB — RAPID URINE DRUG SCREEN, HOSP PERFORMED
Amphetamines: NOT DETECTED
Benzodiazepines: NOT DETECTED
Cocaine: NOT DETECTED
Opiates: NOT DETECTED

## 2013-08-05 LAB — BASIC METABOLIC PANEL
CO2: 22 mEq/L (ref 19–32)
Chloride: 103 mEq/L (ref 96–112)
Creatinine, Ser: 1.01 mg/dL (ref 0.50–1.35)
GFR calc Af Amer: >90 mL/min (ref >90)
GFR calc non Af Amer: >90 mL/min (ref >90)
Potassium: 4.1 mEq/L (ref 3.5–5.1)
Sodium: 137 mEq/L (ref 135–145)

## 2013-08-05 LAB — ETHANOL: Alcohol, Ethyl (B): <11 mg/dL (ref 0–11)

## 2013-08-05 MED ORDER — CHLORDIAZEPOXIDE HCL 25 MG PO CAPS: 25.0000 mg | ORAL_CAPSULE | Freq: Four times a day (QID) | ORAL | Status: DC | PRN

## 2013-08-05 MED ORDER — CHLORDIAZEPOXIDE HCL 25 MG PO CAPS
25.0000 mg | ORAL_CAPSULE | Freq: Four times a day (QID) | ORAL | Status: AC
  Administered 2013-08-05 – 2013-08-07 (×6): 25 mg via ORAL
  Filled 2013-08-05 (×6): qty 1

## 2013-08-05 MED ORDER — CHLORDIAZEPOXIDE HCL 25 MG PO CAPS: 25.0000 mg | ORAL_CAPSULE | Freq: Every day | ORAL | Status: DC

## 2013-08-05 MED ORDER — CHLORDIAZEPOXIDE HCL 25 MG PO CAPS
25.0000 mg | ORAL_CAPSULE | Freq: Three times a day (TID) | ORAL | Status: AC
  Administered 2013-08-07 – 2013-08-08 (×3): 25 mg via ORAL
  Filled 2013-08-05 (×3): qty 1

## 2013-08-05 MED ORDER — ONDANSETRON 4 MG PO TBDP: 4.0000 mg | ORAL_TABLET | Freq: Four times a day (QID) | ORAL | Status: DC | PRN

## 2013-08-05 MED ORDER — ADULT MULTIVITAMIN W/MINERALS CH
1.0000 | ORAL_TABLET | Freq: Every day | ORAL | Status: DC
  Filled 2013-08-05: qty 1

## 2013-08-05 MED ORDER — CHLORDIAZEPOXIDE HCL 25 MG PO CAPS: 25.0000 mg | ORAL_CAPSULE | ORAL | Status: DC

## 2013-08-05 MED ORDER — CHLORDIAZEPOXIDE HCL 25 MG PO CAPS
25.0000 mg | ORAL_CAPSULE | Freq: Four times a day (QID) | ORAL | Status: DC | PRN
Start: 2013-08-05 — End: 2013-08-08

## 2013-08-05 MED ORDER — LORAZEPAM 1 MG PO TABS: 0.0000 mg | ORAL_TABLET | Freq: Two times a day (BID) | ORAL | Status: DC

## 2013-08-05 MED ORDER — THIAMINE HCL 100 MG/ML IJ SOLN: 100.0000 mg | Freq: Once | INTRAMUSCULAR | Status: DC

## 2013-08-05 MED ORDER — CHLORDIAZEPOXIDE HCL 25 MG PO CAPS: 25.0000 mg | ORAL_CAPSULE | Freq: Three times a day (TID) | ORAL | Status: DC

## 2013-08-05 MED ORDER — LOPERAMIDE HCL 2 MG PO CAPS: 2.0000 mg | ORAL_CAPSULE | ORAL | Status: DC | PRN

## 2013-08-05 MED ORDER — ZOLPIDEM TARTRATE 5 MG PO TABS
5.0000 mg | ORAL_TABLET | Freq: Every evening | ORAL | Status: DC | PRN
  Administered 2013-08-05 – 2013-08-06 (×2): 5 mg via ORAL
  Filled 2013-08-05 (×3): qty 1

## 2013-08-05 MED ORDER — VITAMIN B-1 100 MG PO TABS
100.0000 mg | ORAL_TABLET | Freq: Every day | ORAL | Status: DC
  Administered 2013-08-06 – 2013-08-08 (×3): 100 mg via ORAL
  Filled 2013-08-05 (×5): qty 1

## 2013-08-05 MED ORDER — HYDROXYZINE HCL 25 MG PO TABS: 25.0000 mg | ORAL_TABLET | Freq: Four times a day (QID) | ORAL | Status: DC | PRN

## 2013-08-05 MED ORDER — CHLORDIAZEPOXIDE HCL 25 MG PO CAPS: 25.0000 mg | ORAL_CAPSULE | Freq: Four times a day (QID) | ORAL | Status: DC

## 2013-08-05 MED ORDER — LORAZEPAM 1 MG PO TABS
0.0000 mg | ORAL_TABLET | Freq: Four times a day (QID) | ORAL | Status: DC
  Administered 2013-08-05: 1 mg via ORAL
  Filled 2013-08-05: qty 1

## 2013-08-05 MED ORDER — ALUM & MAG HYDROXIDE-SIMETH 200-200-20 MG/5ML PO SUSP: 30.0000 mL | ORAL | Status: DC | PRN

## 2013-08-05 MED ORDER — ADULT MULTIVITAMIN W/MINERALS CH
1.0000 | ORAL_TABLET | Freq: Every day | ORAL | Status: DC
  Administered 2013-08-06 – 2013-08-08 (×3): 1 via ORAL
  Filled 2013-08-05 (×6): qty 1

## 2013-08-05 MED ORDER — VITAMIN B-1 100 MG PO TABS
100.0000 mg | ORAL_TABLET | Freq: Every day | ORAL | Status: DC
  Administered 2013-08-05: 100 mg via ORAL
  Filled 2013-08-05: qty 1

## 2013-08-05 MED ORDER — HYDROXYZINE HCL 25 MG PO TABS
25.0000 mg | ORAL_TABLET | Freq: Four times a day (QID) | ORAL | Status: DC | PRN
  Administered 2013-08-06 – 2013-08-07 (×2): 25 mg via ORAL
  Filled 2013-08-05 (×2): qty 1

## 2013-08-05 MED ORDER — INFLUENZA VAC SPLIT QUAD 0.5 ML IM SUSP
0.5000 mL | INTRAMUSCULAR | Status: DC
  Filled 2013-08-05: qty 0.5

## 2013-08-05 MED ORDER — MAGNESIUM HYDROXIDE 400 MG/5ML PO SUSP: 30.0000 mL | Freq: Every day | ORAL | Status: DC | PRN

## 2013-08-05 MED ORDER — PNEUMOCOCCAL VAC POLYVALENT 25 MCG/0.5ML IJ INJ: 0.5000 mL | INJECTION | INTRAMUSCULAR | Status: DC

## 2013-08-05 MED ORDER — ACETAMINOPHEN 325 MG PO TABS: 650.0000 mg | ORAL_TABLET | Freq: Four times a day (QID) | ORAL | Status: DC | PRN

## 2013-08-05 NOTE — [facility] Group Notes (Signed)
Adult Psychoeducational Group Note  Date:  08/05/2013 Time:  10:22 PM  Group Topic/Focus:  AA Meeting  Participation Level:  Active  Participation Quality:  Appropriate  Affect:  Appropriate  Cognitive:  Appropriate  Insight: Appropriate  Engagement in Group:  Engaged  Modes of Intervention:  Discussion and Education  Additional Comments:  Treysen attended [employer].  Caroll Rancher A 08/05/2013, 10:22 PM

## 2013-08-05 NOTE — ED Notes (Signed)
Report to [facility] for transfer to room 300-1.

## 2013-08-05 NOTE — BH Assessment (Signed)
Consulted with Psych extender Josephine who reports that she is making rounds and evaluating patients. Jospehine agreed that she would evaluate patient.   Sean Levesque, MS, LCASA Assessment Counselor  

## 2013-08-05 NOTE — ED Notes (Signed)
Per pt/family-has been drinking since he was 42 y/o-since April he has lost his father, brother and grandmother to tragedies-been drinking heavily since their deaths-no past treatments-8-14 beers daily-last drink yesterday-no s/s's of withdrawl

## 2013-08-05 NOTE — ED Notes (Signed)
Has been to [facility] today-was told by Dewayne Hatch he would have a bed once medically cleared

## 2013-08-05 NOTE — BH Assessment (Signed)
Consulted with Raymon Mutton, [facility] Extender who is requesting a TTS consult for patient who is presenting requesting Medical Clearance to be evaluated for  Inpatient Alcohol Detox Treatment.  Notified CSW Belenda Cruise that patient needs to be evaluated. Belenda Cruise reports that she will consult with psych extender and they will coordinate times to assess patient.   Glorious Peach, MS, LCASA Assessment Counselor

## 2013-08-05 NOTE — BH Assessment (Signed)
[facility] Assessment Progress Note     Contacted [employer] to see pre-authorization for inpatient services. Gave overview of psycho-social stressors and review of assessment. Patient was approved for 11/7, 11/8 and 08/07/13 for inpatient services. Authorization number [name] is the Care Advocate assigned to Sean Rivas.  UR will need to contact Care Advocated on Monday 08/08/13 for consideration of continuing treatment.   Shon Baton, MSW, LCSW, Bridget Hartshorn

## 2013-08-05 NOTE — ED Provider Notes (Signed)
CSN: [id number]     Arrival date & time 08/05/13  0930 History   First MD Initiated Contact with Patient 08/05/13 1013     Chief Complaint  Patient presents with  . ETOH detox    (Consider location/radiation/quality/duration/timing/severity/associated sxs/prior Treatment) The history is provided by the patient. No language interpreter was used.  Sean Rivas is a 42 year old male with past medical history of coronary artery disease with stent placement in 2012, hyperlipidemia, hypertension, GERD presenting to emergency department with alcohol detox request the as per patient, reported that he went to behavioral [facility] and spoke with an individual there who recommended patient to come to emergency department for clearance. Patient reports he has been drinking since he was 23-68 years of age. Reported that since April 2014 there is numerous tragedies which led him to drink every day since then. Patient reports he drinks every day at least 8-14 beers. Reported that his last drink was at 8:00 PM last night when he drank 12-14 beers. Patient reports he takes his medications every day. Patient reports that he has mild shaking when he does not drink and when he gets nervous to his hands. Patient currently smokes one pack of cigarettes per day. Denied headache, dizziness, blurred vision, chest pain, short of breath, difficulty breathing, abdominal pain, nausea, vomiting, diarrhea, melena, hematochezia, neck pain, back pain, neck stiffness, worsening depression, suicidal ideation, homicidal ideation. PCP Dr. Dimas Aguas  Past Medical History  Diagnosis Date  . Coronary artery disease     A.  07/18/2004 - PCI/DES OM2: 2.5x13 Cypher DES Dist/3.0x13 Cypher Prox;   B.  06/2009 & 06/2011- NL Stress Echo EF 70-75%;  C. 09/21/2011 s/p STEMI 2/2 late stent thrombosis -  thrombectomy and PCI/DES 3.0 x 28 mm Promus  . Hyperlipidemia   . Hypertension   . GERD (gastroesophageal reflux disease)   . Tobacco  abuse     24 pack year - 1ppd as of 09/21/2011   Past Surgical History  Procedure Laterality Date  . Coronary stent placement     Family History  Problem Relation Age of Onset  . Coronary artery disease Brother 82    s/p stenting  . Heart failure Brother 36    s/p ICD  . Coronary artery disease Mother 63    deceased @ 74   History  Substance Use Topics  . Smoking status: Current Every Day Smoker -- 1.00 packs/day for 24 years    Types: Cigarettes  . Smokeless tobacco: Never Used  . Alcohol Use: 14.4 oz/week    24 Cans of beer per week     Comment: 8-14 beers daily    Review of Systems  Constitutional: Negative for fever and chills.  HENT: Negative for trouble swallowing.   Respiratory: Negative for chest tightness and shortness of breath.   Cardiovascular: Negative for chest pain.  Gastrointestinal: Negative for nausea, vomiting, abdominal pain and diarrhea.  Musculoskeletal: Negative for back pain and neck pain.  Neurological: Positive for tremors. Negative for dizziness, weakness, numbness and headaches.  Psychiatric/Behavioral:       Alcohol   All other systems reviewed and are negative.    Allergies  Review of patient's allergies indicates no known allergies.  Home Medications   Current Outpatient Rx  Name  Route  Sig  Dispense  Refill  . allopurinol (ZYLOPRIM) 100 MG tablet   Oral   Take 100 mg by mouth daily.          [name] aspirin EC  81 MG tablet   Oral   Take 81 mg by mouth daily.          . clomiPHENE (CLOMID) 50 MG tablet      TAKE 1/4 TABLET BY MOUTH EVERY DAY   15 tablet   0     WE SENT THIS ON MONDAY....Marland KitchenNO RESPONSE.....PLEASE  .[name].   . gemfibrozil (LOPID) 600 MG tablet   Oral   Take 600 mg by mouth 2 (two) times daily before a meal.           . lisinopril (PRINIVIL,ZESTRIL) 20 MG tablet      TAKE ONE TABLET BY MOUTH EVERY DAY   30 tablet   6   . LORazepam (ATIVAN) 1 MG tablet   Oral   Take 1 tablet (1 mg total) by mouth every 8  (eight) hours as needed.   45 tablet   0     Pt is due for 6 month follow up appointment per ch ...   . metoprolol succinate (TOPROL-XL) 25 MG 24 hr tablet   Oral   Take 25 mg by mouth daily.         . nitroGLYCERIN (NITROSTAT) 0.4 MG SL tablet   Sublingual   Place 1 tablet (0.4 mg total) under the tongue every 5 (five) minutes as needed. For chest pain   90 tablet   3   . pantoprazole (PROTONIX) 40 MG tablet   Oral   Take 40 mg by mouth daily.         . prasugrel (EFFIENT) 10 MG TABS   Oral   Take 1 tablet (10 mg total) by mouth daily.   30 tablet   11     Patient needs to call office and schedule 6 month  ...   . sertraline (ZOLOFT) 50 MG tablet   Oral   Take 50 mg by mouth daily.         . simvastatin (ZOCOR) 80 MG tablet   Oral   Take 80 mg by mouth at bedtime.           [name] EXPIRED: metoprolol succinate (TOPROL XL) 25 MG 24 hr tablet   Oral   Take 1 tablet (25 mg total) by mouth daily.   30 tablet   6   . EXPIRED: sildenafil (VIAGRA) 50 MG tablet   Oral   Take 1 tablet (50 mg total) by mouth daily as needed for erectile dysfunction.   20 tablet   0     Refer to pcp for refills    BP 140/94  Pulse 76  Temp(Src) 98.2 F (36.8 C) (Oral)  Resp 18  SpO2 96% Physical Exam  Nursing note and vitals reviewed. Constitutional: He is oriented to person, place, and time. He appears well-developed and well-nourished. No distress.  HENT:  Head: Normocephalic and atraumatic.  Mouth/Throat: Oropharynx is clear and moist. No oropharyngeal exudate.  Eyes: Conjunctivae and EOM are normal. Pupils are equal, round, and reactive to light. Right eye exhibits no discharge. Left eye exhibits no discharge.  Neck: Normal range of motion. Neck supple.  Negative neck stiffness Negative nuchal rigidity Negative cervical LAD Negative pain upon palpation to the cervical spine   Cardiovascular: Normal rate, regular rhythm and normal heart sounds.  Exam reveals no  friction rub.   No murmur heard. Pulses:      Radial pulses are 2+ on the right side, and 2+ on the left side.  Pulmonary/Chest: Effort normal and breath  sounds normal. No respiratory distress. He has no wheezes. He has no rales. He exhibits no tenderness.  Abdominal: Soft. Bowel sounds are normal. There is no tenderness. There is no guarding.  Musculoskeletal: Normal range of motion.  Lymphadenopathy:    He has no cervical adenopathy.  Neurological: He is alert and oriented to person, place, and time. No cranial nerve deficit. He exhibits normal muscle tone. Coordination normal.  Strength 5+/5+ to upper and lower extremities with resistance applied, equal distribution noted Mild shaking noted to the right hand when compared to the left hand  Skin: Skin is warm and dry. No rash noted. He is not diaphoretic. No erythema.  Psychiatric: He has a normal mood and affect. His behavior is normal. Thought content normal.  Appears goal-oriented    ED Course  Procedures (including critical care time)  This provider reviewed the patient's chart - patient was recently seen on 08/02/2013 regarding chest pain. Patient has a history of cardiac issues - in 2000 and 2012 stress echocardiogram was performed with an ejection fraction ranging from 70-75%, normal stress echo. In December 2012 patient is status post STEMI with late stent thrombosis with thrombectomy and stent placement. Patient was seen on 08/02/2013 regarding chest pain where 2 sets of troponins were drawn with negative elevation, negative EKG changes or ischemic findings noted. Patient was discharged.  Labs Review Labs Reviewed  CBC WITH DIFFERENTIAL  BASIC METABOLIC PANEL  ETHANOL  URINE RAPID DRUG SCREEN (HOSP PERFORMED)  URINALYSIS, ROUTINE W REFLEX MICROSCOPIC   Imaging Review No results found.  EKG Interpretation   None       MDM  No diagnosis found.  Filed Vitals:   08/05/13 1221 08/05/13 1236 08/05/13 1524 08/05/13 1731   BP: 134/99 134/99 134/99   Pulse: 65 65 65 65  Temp: 97.8 F (36.6 C)   97.8 F (36.6 C)  TempSrc: Oral   Oral  Resp: 16   16  SpO2: 95%   95%    Patient presenting to the ED with request for alcohol detox. Patient reported since April 2014 has been drinking a lot of alcohol, everyday, at least 8-14 beers per day secondary to tragedies in life. Last drink 8:00pm last night. Increased depression - denied SI and HI, denied hallucinations.  Alert and oriented. Lungs clear to auscultation. Heart rate and rhythm normal. Full ROM to upper and lower extremities bilaterally. Pulses palpable. Mild tremors to the right hand - negative asterixis. Animated face, goal-oriented. Urine positive for barbituates. UA negative for infection. CBC and CMP negative findings. Negative elevation in ethanol.   Patient noted currently in DTs. Patient medically cleared. Psych orders placed. Consult TTS ordered. CIWA protocol ordered. Patient moved to psych ED.      Raymon Mutton, PA-C 08/07/13 1503

## 2013-08-05 NOTE — Progress Notes (Signed)
Patient ID: Sean Rivas, male   DOB: [birth year], 42 y.o.   MRN: [id number] Patient admitted to [facility] Adult unit for admission requesting detox.  Patient reports four significant losses in family,and states ''need to get things together for family ''  and reports drinking 8 - 14 beers daily. Patient upon admission presents with depressed mood, affect congruent. Patient denies any SI/HI at this time and is able to contract for safety. Pt search completed no contraband found.  LOW fall risk. Pt oriented to unit and treatment process. Will continue to monitor q 15 minutes for safety.

## 2013-08-05 NOTE — Consult Note (Signed)
[facility] Face-to-Face Psychiatry Consult   Reason for Consult:  Alcohol Dependence, Major depressive d/o, recurrent Referring Physician:  EDP [name] is an 42 y.o. male.  Assessment: AXIS I:  Anxiety Disorder NOS, Major Depression, Recurrent severe and Alcohol dependence AXIS II:  Deferred AXIS III:   Past Medical History  Diagnosis Date  . Coronary artery disease     A.  07/18/2004 - PCI/DES OM2: 2.5x13 Cypher DES Dist/3.0x13 Cypher Prox;   B.  06/2009 & 06/2011- NL Stress Echo EF 70-75%;  C. 09/21/2011 s/p STEMI 2/2 late stent thrombosis -  thrombectomy and PCI/DES 3.0 x 28 mm Promus  . Hyperlipidemia   . Hypertension   . GERD (gastroesophageal reflux disease)   . Tobacco abuse     24 pack year - 1ppd as of 09/21/2011   AXIS IV:  other psychosocial or environmental problems, problems related to social environment and unresolved grieving, multiple deaths in the immediate family AXIS V:  41-50 serious symptoms  Plan:  Recommend psychiatric Inpatient admission when medically cleared.  Subjective:   Sean Rivas is a 42 y.o. male patient admitted with Alcohol dependence, Major depressive d/o, recurrent.  HPI:  DJ Rivas is a 42 year old male with past medical history of coronary artery disease with stent placement in 2012, hyperlipidemia, hypertension, GERD presenting to emergency department requesting alcohol detoxification.   Patient reports he has been drinking since he was [age] years of age.   He also reports drinking at least 8-14 beers daily. Reported that his last drink was at 8:00 PM last night when he drank 12-14 beers.  He also reports his drinking has increased due to 3 deaths in the immediate family this year and his depression also got worse.   Patient reports he takes Zoloft prescribed by his PMD every day. Patient reports that he has mild shaking when he does not drink and when he gets nervous to his hands.  He reports anxiety and frequently paces in the house  to the extent of his children calling his wife and telling her.  Patient states he is having difficulty dealing and grieving the death of his brother from MI, his father from alcohol related suicide and his mother from a natural death.  He reports feeling increased anxiety thinking he may end up like his father.  He is also reporting poor concentration at work and that he is about to loose his job.  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  He usually takes Ambien but takes his Ambien with alcohol.  He reports years back in 2001, he saw a  therapist  at [facility] at the urging of his director for alcohol related issue.  At that time he was separated from his wife.  Patient reports poor appetite but denies loosing weight.  He has a supportive wife and family but is worried about his drinking, depression and anxiety.  He denies SI/SA/ HI/AVH.  He has a gun in the house and both him and his wife have agreed to move the gun to a different location for safety.  We have accepted patient for admission and detox in our inpatient Chemical dependency unit.   HPI Elements:   Location:  [facility]. Quality:  severe to the extent of getting worried about hurting himself. Severity:  moderate. Duration:  since age 38-15, has been drinking off and on. Context:  Death of 3 immediate family members..  Past Psychiatric History:  Past Medical History  Diagnosis Date  . Coronary artery disease     A.  07/18/2004 - PCI/DES OM2: 2.5x13 Cypher DES Dist/3.0x13 Cypher Prox;   B.  06/2009 & 06/2011- NL Stress Echo EF 70-75%;  C. 09/21/2011 s/p STEMI 2/2 late stent thrombosis -  thrombectomy and PCI/DES 3.0 x 28 mm Promus  . Hyperlipidemia   . Hypertension   . GERD (gastroesophageal reflux disease)   . Tobacco abuse     24 pack year - 1ppd as of 09/21/2011    reports that he has been smoking Cigarettes.  He has a 24 pack-year smoking history. He has never used smokeless  tobacco. He reports that he drinks about 14.4 ounces of alcohol per week. He reports that he does not use illicit drugs. Family History  Problem Relation Age of Onset  . Coronary artery disease Brother 28    s/p stenting  . Heart failure Brother 36    s/p ICD  . Coronary artery disease Mother 40    deceased @ 58           Allergies:  No Known Allergies  ACT Assessment Complete:  Yes:    Educational Status    Risk to Self: Risk to self Is patient at risk for suicide?: No Substance abuse history and/or treatment for substance abuse?: Yes  Risk to Others:  no  Abuse:  Alcohol, no other drugs  Prior Inpatient Therapy:  none  Prior Outpatient Therapy:  2001, [facility]  Additional Information:                    Objective: Blood pressure 134/99, pulse 65, temperature 97.8 F (36.6 C), temperature source Oral, resp. rate 16, SpO2 95.00%.There is no weight on file to calculate BMI. Results for orders placed during the hospital encounter of 08/05/13 (from the past 72 hour(s))  CBC WITH DIFFERENTIAL     Status: Abnormal   Collection Time    08/05/13 10:25 AM      Result Value Range   WBC 6.9  4.0 - 10.5 K/uL   RBC 5.01  4.22 - 5.81 MIL/uL   Hemoglobin 14.7  13.0 - 17.0 g/dL   HCT 16.1  09.6 - 04.5 %   MCV 85.8  78.0 - 100.0 fL   MCH 29.3  26.0 - 34.0 pg   MCHC 34.2  30.0 - 36.0 g/dL   RDW 40.9  81.1 - 91.4 %   Platelets 264  150 - 400 K/uL   Neutrophils Relative % 56  43 - 77 %   Neutro Abs 3.9  1.7 - 7.7 K/uL   Lymphocytes Relative 30  12 - 46 %   Lymphs Abs 2.1  0.7 - 4.0 K/uL   Monocytes Relative 6  3 - 12 %   Monocytes Absolute 0.4  0.1 - 1.0 K/uL   Eosinophils Relative 7 (*) 0 - 5 %   Eosinophils Absolute 0.5  0.0 - 0.7 K/uL   Basophils Relative 0  0 - 1 %   Basophils Absolute 0.0  0.0 - 0.1 K/uL  BASIC METABOLIC PANEL     Status: Abnormal   Collection Time    08/05/13 10:25 AM      Result Value Range   Sodium 137  135 - 145 mEq/L    Potassium 4.1  3.5 - 5.1 mEq/L   Chloride 103  96 - 112 mEq/L   CO2 22  19 - 32 mEq/L   Glucose, Bld  119 (*) 70 - 99 mg/dL   BUN 17  6 - 23 mg/dL   Creatinine, Ser 1.61  0.50 - 1.35 mg/dL   Calcium 9.9  8.4 - 09.6 mg/dL   GFR calc non Af Amer >90  >90 mL/min   GFR calc Af Amer >90  >90 mL/min   Comment: (NOTE)     The eGFR has been calculated using the CKD EPI equation.     This calculation has not been validated in all clinical situations.     eGFR's persistently <90 mL/min signify possible Chronic Kidney     Disease.  ETHANOL     Status: None   Collection Time    08/05/13 10:25 AM      Result Value Range   Alcohol, Ethyl (B) <11  0 - 11 mg/dL   Comment:            LOWEST DETECTABLE LIMIT FOR     SERUM ALCOHOL IS 11 mg/dL     FOR MEDICAL PURPOSES ONLY  URINE RAPID DRUG SCREEN (HOSP PERFORMED)     Status: Abnormal   Collection Time    08/05/13 11:09 AM      Result Value Range   Opiates NONE DETECTED  NONE DETECTED   Cocaine NONE DETECTED  NONE DETECTED   Benzodiazepines NONE DETECTED  NONE DETECTED   Amphetamines NONE DETECTED  NONE DETECTED   Tetrahydrocannabinol NONE DETECTED  NONE DETECTED   Barbiturates POSITIVE (*) NONE DETECTED   Comment:            DRUG SCREEN FOR MEDICAL PURPOSES     ONLY.  IF CONFIRMATION IS NEEDED     FOR ANY PURPOSE, NOTIFY LAB     WITHIN 5 DAYS.                LOWEST DETECTABLE LIMITS     FOR URINE DRUG SCREEN     Drug Class       Cutoff (ng/mL)     Amphetamine      1000     Barbiturate      200     Benzodiazepine   200     Tricyclics       300     Opiates          300     Cocaine          300     THC              50  URINALYSIS, ROUTINE W REFLEX MICROSCOPIC     Status: Abnormal   Collection Time    08/05/13 11:09 AM      Result Value Range   Color, Urine YELLOW  YELLOW   APPearance CLOUDY (*) CLEAR   Specific Gravity, Urine 1.027  1.005 - 1.030   pH 7.0  5.0 - 8.0   Glucose, UA NEGATIVE  NEGATIVE mg/dL   Hgb urine dipstick  NEGATIVE  NEGATIVE   Bilirubin Urine NEGATIVE  NEGATIVE   Ketones, ur NEGATIVE  NEGATIVE mg/dL   Protein, ur NEGATIVE  NEGATIVE mg/dL   Urobilinogen, UA 1.0  0.0 - 1.0 mg/dL   Nitrite NEGATIVE  NEGATIVE   Leukocytes, UA NEGATIVE  NEGATIVE   Comment: MICROSCOPIC NOT DONE ON URINES WITH NEGATIVE PROTEIN, BLOOD, LEUKOCYTES, NITRITE, OR GLUCOSE <1000 mg/dL.   Labs are reviewed and are pertinent for UDS is positive for Barbiturates, rest of the result are unremarkable.  Current Facility-Administered Medications  Medication Dose Route  Frequency Provider Last Rate Last Dose  . LORazepam (ATIVAN) tablet 0-4 mg  0-4 mg Oral Q6H Marissa Sciacca, PA-C   1 mg at 08/05/13 1228   Followed by  . [START ON 08/07/2013] LORazepam (ATIVAN) tablet 0-4 mg  0-4 mg Oral Q12H Marissa Sciacca, PA-C       Current Outpatient Prescriptions  Medication Sig Dispense Refill  . allopurinol (ZYLOPRIM) 100 MG tablet Take 100 mg by mouth daily.       [name] aspirin EC 81 MG tablet Take 81 mg by mouth daily.       . clomiPHENE (CLOMID) 50 MG tablet Take 12.5 mg by mouth daily.      [name] gemfibrozil (LOPID) 600 MG tablet Take 600 mg by mouth 2 (two) times daily before a meal.        . lisinopril (PRINIVIL,ZESTRIL) 20 MG tablet Take 20 mg by mouth daily.      [name] LORazepam (ATIVAN) 1 MG tablet Take 1 tablet (1 mg total) by mouth every 8 (eight) hours as needed.  45 tablet  0  . metoprolol succinate (TOPROL-XL) 25 MG 24 hr tablet Take 25 mg by mouth daily.      . nitroGLYCERIN (NITROSTAT) 0.4 MG SL tablet Place 1 tablet (0.4 mg total) under the tongue every 5 (five) minutes as needed. For chest pain  90 tablet  3  . pantoprazole (PROTONIX) 40 MG tablet Take 40 mg by mouth daily.      . prasugrel (EFFIENT) 10 MG TABS Take 1 tablet (10 mg total) by mouth daily.  30 tablet  11  . sertraline (ZOLOFT) 50 MG tablet Take 50 mg by mouth daily.      . simvastatin (ZOCOR) 80 MG tablet Take 80 mg by mouth at bedtime.        . metoprolol  succinate (TOPROL XL) 25 MG 24 hr tablet Take 1 tablet (25 mg total) by mouth daily.  30 tablet  6  . sildenafil (VIAGRA) 50 MG tablet Take 1 tablet (50 mg total) by mouth daily as needed for erectile dysfunction.  20 tablet  0    Psychiatric Specialty Exam:     Blood pressure 134/99, pulse 65, temperature 97.8 F (36.6 C), temperature source Oral, resp. rate 16, SpO2 95.00%.There is no weight on file to calculate BMI.  General Appearance: Casual  Eye Contact::  Good  Speech:  Clear and Coherent and Normal Rate  Volume:  Normal  Mood:  Anxious, Depressed and Hopeless  Affect:  Congruent, Depressed, Flat, Labile and Tearful  Thought Process:  Coherent, Goal Directed and Intact  Orientation:  Full (Time, Place, and Person)  Thought Content:  NA  Suicidal Thoughts:  No  Homicidal Thoughts:  No  Memory:  Immediate;   Good Recent;   Good Remote;   Good  Judgement:  Fair  Insight:  Good  Psychomotor Activity:  Normal  Concentration:  Fair  Recall:  NA  Akathisia:  NA  Handed:  Right  AIMS (if indicated):     Assets:  Desire for Improvement  Sleep:      Treatment Plan Summary:  Consult and face to face interview with Dr Tawni Carnes We have accepted patient for admission in our inpatient unit for alcohol detox We will resume his Zoloft for depression We expect and will encourage him to participate in our group and and individual therapy while in the unit. Daily contact with patient to assess and evaluate symptoms and progress in treatment Medication management  Earney Navy  PMHNP-BC 08/05/2013 12:48 PM  Pt was interviewed with NP. Agree with above assessment and plan. Ancil Linsey, MD

## 2013-08-05 NOTE — ED Notes (Signed)
Pt wife took pt all of pt belongings with her. Pt only has possession of his glasses

## 2013-08-06 ENCOUNTER — Encounter (HOSPITAL_COMMUNITY): Payer: Self-pay | Admitting: Psychiatry

## 2013-08-06 DIAGNOSIS — Z634 Disappearance and death of family member: Secondary | ICD-10-CM

## 2013-08-06 DIAGNOSIS — F1020 Alcohol dependence, uncomplicated: Secondary | ICD-10-CM | POA: Diagnosis present

## 2013-08-06 DIAGNOSIS — F1010 Alcohol abuse, uncomplicated: Secondary | ICD-10-CM

## 2013-08-06 DIAGNOSIS — F1994 Other psychoactive substance use, unspecified with psychoactive substance-induced mood disorder: Secondary | ICD-10-CM | POA: Diagnosis present

## 2013-08-06 DIAGNOSIS — F4310 Post-traumatic stress disorder, unspecified: Secondary | ICD-10-CM | POA: Diagnosis present

## 2013-08-06 DIAGNOSIS — F411 Generalized anxiety disorder: Secondary | ICD-10-CM

## 2013-08-06 DIAGNOSIS — F332 Major depressive disorder, recurrent severe without psychotic features: Secondary | ICD-10-CM | POA: Diagnosis present

## 2013-08-06 MED ORDER — ALLOPURINOL 100 MG PO TABS
100.0000 mg | ORAL_TABLET | Freq: Every day | ORAL | Status: DC
  Administered 2013-08-06 – 2013-08-08 (×3): 100 mg via ORAL
  Filled 2013-08-06 (×5): qty 1

## 2013-08-06 MED ORDER — PANTOPRAZOLE SODIUM 40 MG PO TBEC
40.0000 mg | DELAYED_RELEASE_TABLET | Freq: Two times a day (BID) | ORAL | Status: DC
  Administered 2013-08-06 – 2013-08-08 (×4): 40 mg via ORAL
  Filled 2013-08-06 (×8): qty 1

## 2013-08-06 MED ORDER — SERTRALINE HCL 50 MG PO TABS
50.0000 mg | ORAL_TABLET | Freq: Every day | ORAL | Status: DC
  Administered 2013-08-06 – 2013-08-08 (×3): 50 mg via ORAL
  Filled 2013-08-06 (×5): qty 1

## 2013-08-06 MED ORDER — CLOMIPHENE CITRATE 50 MG PO TABS: 12.5000 mg | ORAL_TABLET | Freq: Every day | ORAL | Status: DC

## 2013-08-06 MED ORDER — CLOMIPHENE CITRATE 50 MG PO TABS: 50.0000 mg | ORAL_TABLET | Freq: Every day | ORAL | Status: DC

## 2013-08-06 MED ORDER — ASPIRIN EC 81 MG PO TBEC
81.0000 mg | DELAYED_RELEASE_TABLET | Freq: Every day | ORAL | Status: DC
  Administered 2013-08-06 – 2013-08-08 (×3): 81 mg via ORAL
  Filled 2013-08-06 (×5): qty 1

## 2013-08-06 MED ORDER — METOPROLOL SUCCINATE ER 25 MG PO TB24
25.0000 mg | ORAL_TABLET | Freq: Every day | ORAL | Status: DC
  Administered 2013-08-06 – 2013-08-08 (×3): 25 mg via ORAL
  Filled 2013-08-06 (×6): qty 1

## 2013-08-06 MED ORDER — LISINOPRIL 20 MG PO TABS
20.0000 mg | ORAL_TABLET | Freq: Every day | ORAL | Status: DC
  Administered 2013-08-06 – 2013-08-08 (×3): 20 mg via ORAL
  Filled 2013-08-06 (×5): qty 1

## 2013-08-06 MED ORDER — PRASUGREL HCL 10 MG PO TABS
10.0000 mg | ORAL_TABLET | Freq: Every day | ORAL | Status: DC
  Administered 2013-08-06 – 2013-08-08 (×3): 10 mg via ORAL
  Filled 2013-08-06 (×5): qty 1

## 2013-08-06 MED ORDER — GEMFIBROZIL 600 MG PO TABS
600.0000 mg | ORAL_TABLET | Freq: Two times a day (BID) | ORAL | Status: DC
  Administered 2013-08-06 – 2013-08-08 (×4): 600 mg via ORAL
  Filled 2013-08-06 (×8): qty 1

## 2013-08-06 MED ORDER — SIMVASTATIN 20 MG PO TABS
20.0000 mg | ORAL_TABLET | Freq: Every day | ORAL | Status: DC
  Administered 2013-08-06 – 2013-08-07 (×2): 20 mg via ORAL
  Filled 2013-08-06 (×4): qty 1

## 2013-08-06 MED ORDER — NITROGLYCERIN 0.4 MG SL SUBL: 0.4000 mg | SUBLINGUAL_TABLET | SUBLINGUAL | Status: DC | PRN

## 2013-08-06 NOTE — [facility] Group Notes (Signed)
[facility] Group Notes:  (Clinical Social Work)  08/06/2013    1-2pm  Summary of Progress/Problems:   The main focus of today's process group was for the patient to identify ways in which they have in the past sabotaged their own recovery. Motivational Interviewing was utilized to ask the group members what they get out of their substance use, and what reasons they may have for wanting to change.  The Stages of Change were explained using a handout, and patients identified where they currently are with regard to stages of change.  The patient expressed that he drinks because of his anxiety, stating that since 4 people in his life have died in the last 7 months, he is fearful for anybody to do anything because he thinks something else is going to happen.  He becomes obsessive about this.  He obsesses about the house to the extent that he cannot sleep unless he gets up and washed the glass in the sink. His wife is talking about moving in with their daughter until he gets this under control.  He feels he is in Preparation Stage of Change.  Type of Therapy:  Group Therapy - Process   Participation Level:  Active  Participation Quality:  Attentive and Sharing  Affect:  Blunted  Cognitive:  Oriented  Insight:  Engaged  Engagement in Therapy:  Engaged  Modes of Intervention:  Education, Teacher, English as a foreign language, Motivational Interviewing  Ambrose Mantle, LCSW 08/06/2013, 12:09 PM

## 2013-08-06 NOTE — [facility] Suicide Risk Assessment (Signed)
Suicide Risk Assessment  Admission Assessment     Nursing information obtained from:  Patient Demographic factors:  Male;Caucasian Current Mental Status:    Loss Factors:  Loss of significant relationship Historical Factors:  Family history of suicide Risk Reduction Factors:     CLINICAL FACTORS:   Severe Anxiety and/or Agitation Depression:   Anhedonia Comorbid alcohol abuse/dependence Impulsivity Recent sense of peace/wellbeing Severe Alcohol/Substance Abuse/Dependencies Previous Psychiatric Diagnoses and Treatments Medical Diagnoses and Treatments/Surgeries  COGNITIVE FEATURES THAT CONTRIBUTE TO RISK:  Closed-mindedness Loss of executive function Polarized thinking Thought constriction (tunnel vision)    SUICIDE RISK:   Moderate:  Frequent suicidal ideation with limited intensity, and duration, some specificity in terms of plans, no associated intent, good self-control, limited dysphoria/symptomatology, some risk factors present, and identifiable protective factors, including available and accessible social support.  PLAN OF CARE: Admitted from [facility] for alcohol detox treatment and substance induced mood disorder.  I certify that inpatient services furnished can reasonably be expected to improve the patient's condition.   Hardy Harcum,JANARDHAHA R. 08/06/2013, 9:44 AM
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
## 2013-08-06 NOTE — Progress Notes (Signed)
Patient ID: Sean Rivas, male   DOB: [birth year], 42 y.o.   MRN: [id number]  D: Pt has been very flat and depressed on the unit today, pt has also been very tearful at times. Pt reported that he remains very upset at his past traumas and that he remains very upset with his dad. Pt reported that he does drink but that he would never put alcohol before his family. Pt has no insight and continues to minimize his drinking problems. Pt reported that he just wanted to be discharged, because he does not like to be confined to the unit and that this place is only making him worse. Pt reported being negative SI/HI, no AH/VH noted. A: 15 min checks continued for patient safety. R: Pt safety maintained.
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
## 2013-08-06 NOTE — Progress Notes (Signed)
Patient ID: Sean Rivas, male   DOB: 09-Oct-1970, 42 y.o.   MRN: [id number] D: Patient in dayroom on approach. Pt mood/affect appeared depressed and flat. Pt stated he wants to stop drinking for his grandchild. Pt denies any symptoms of withdrawals. Pt denies SI/HI/AVH and pain. Pt attended evening AA group and engaged in discussion. Pt denies any needs or concerns.  Cooperative with assessment. No acute distressed noted at this time.   A: Met with pt 1:1. Medications administered as prescribed. Writer encouraged pt to discuss feelings. Pt encouraged to come to staff with any question or concerns.   R: Patient remains safe. He is complaint with medications and denies any adverse reaction. Continue current POC.

## 2013-08-06 NOTE — Progress Notes (Signed)
D.  Pt pleasant on approach, denies complaints at this time.  Did request medication for sleep.  Positive for evening wrap up group, see group notes.  Denies SI/HI/hallucinations at this time.  A.  Support and encouragement offered.  Medication given as ordered for sleep.  R.  Pt remains safe on unit, will continue to monitor.

## 2013-08-06 NOTE — Progress Notes (Signed)
[facility] Group Notes:  (Nursing/MHT/Case Management/Adjunct)  Date:  08/06/2013  Time:  8:00p.m.  Type of Therapy:  Psychoeducational Skills  Participation Level:  Active  Participation Quality:  Attentive  Affect:  Appropriate  Cognitive:  Appropriate  Insight:  Good  Engagement in Group:  Developing/Improving  Modes of Intervention:  Education  Summary of Progress/Problems: The patient shared with the group that he had a good day since he states that things felt "easier" for him today. As a theme for the day, he shared that his relapse prevention involved taking personal responsibility for his actions.   Hazle Coca S 08/06/2013, 11:37 PM

## 2013-08-06 NOTE — [facility] Group Notes (Signed)
[facility] Group Notes:  (Nursing/MHT/Case Management/Adjunct)  Date:  08/06/2013  Time:  12:38 PM  Type of Therapy:  Psychoeducational Skills  Participation Level:  Active  Participation Quality:  Appropriate  Affect:  Appropriate  Cognitive:  Appropriate  Insight:  Appropriate  Engagement in Group:  Engaged  Modes of Intervention:  Problem-solving  Summary of Progress/Problems: Pt attended self inventory group, and also worked on Optician, dispensing.   Jacquelyne Balint Shanta 08/06/2013, 12:38 PM

## 2013-08-07 NOTE — Progress Notes (Signed)
Patient ID: Sean Rivas, male   DOB: May 31, 1971, 42 y.o.   MRN: [id number]  D: Pt continues to be flat and depressed on the unit. Pt reported that he does not need to be in a place like this and that he just needs to be at home with his wife and kids. Pt is hoping that he can be discharged on Monday and start his outpatient treatment. Pt reports no issues with his medication regimen, and reports no withdrawal symptoms. Pt reported being negative SI/HI, no AH/VH noted.  A: 15 min checks continued for patient safety. R: Pts safety maintained.
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
## 2013-08-07 NOTE — [facility] Group Notes (Signed)
[facility] Group Notes:  (Nursing/MHT/Case Management/Adjunct)  Date:  08/07/2013  Time:  2:33 PM  Type of Therapy:  Nurse Education  Participation Level:  Active  Participation Quality:  Appropriate and Attentive  Affect:  Appropriate  Cognitive:  Alert and Appropriate  Insight:  Appropriate and Good  Engagement in Group:  Engaged  Modes of Intervention:  Education  Summary of Progress/Problems:  Sean Rivas 08/07/2013, 2:33 PM

## 2013-08-07 NOTE — Progress Notes (Signed)
D.  Pt pleasant on approach denies complaints at this time, feels ready for discharge tomorrow.  Plan is to begin outpatient treatment.  Positive for evening AA group.  Interacting appropriately within milieu.  Denies SI/HI/hallucinations at this time.  A.  Support and encouragement offered  R.  Pt remains safe on unit, will continue to monitor.

## 2013-08-07 NOTE — Progress Notes (Signed)
[facility] MD Progress Note  08/07/2013 12:24 PM Sean Rivas  MRN:  [id number] Subjective:  Patient states he is feeling well and eating well. He states that he came in because his wife asked him to get some help and he hopes to return to his job. He denies any WD symptoms. Diagnosis:   Trauma-Stressor Disorders: Posttraumatic Stress Disorder (309.81)  Substance/Addictive Disorders: Alcohol Related Disorder - Severe (303.90) and Alcohol Intoxication with Use Disorder - Severe (F10.229)  Depressive Disorders: Major Depressive Disorder - Severe (296.23)  AXIS I: Alcohol Abuse, Anxiety Disorder NOS and Major Depression, Recurrent severe  AXIS II: Deferred  AXIS III:  Past Medical History   Diagnosis  Date   .  Coronary artery disease      A. 07/18/2004 - PCI/DES OM2: 2.5x13 Cypher DES Dist/3.0x13 Cypher Prox; B. 06/2009 & 06/2011- NL Stress Echo EF 70-75%; C. 09/21/2011 s/p STEMI 2/2 late stent thrombosis - thrombectomy and PCI/DES 3.0 x 28 mm Promus   .  Hyperlipidemia    .  Hypertension    .  GERD (gastroesophageal reflux disease)    .  Tobacco abuse      24 pack year - 1ppd as of 09/21/2011    AXIS IV: occupational problems, other psychosocial or environmental problems, problems related to social environment and problems with primary support group  AXIS V: 41-50 serious symptoms  ADL's:  Intact  Sleep: Good  Appetite:  Good  Suicidal Ideation:  denies Homicidal Ideation:  denies AEB (as evidenced by):  Psychiatric Specialty Exam: ROS  Blood pressure 97/72, pulse 65, temperature 97.7 F (36.5 C), temperature source Oral, resp. rate 18, height 5\' 10"  (1.778 m), weight 99.791 kg (220 lb), SpO2 99.00%.Body mass index is 31.57 kg/(m^2).  General Appearance: Fairly Groomed  Patent attorney::  Fair  Speech:  Clear and Coherent  Volume:  Normal  Mood:  Euthymic  Affect:  Congruent  Thought Process:  Goal Directed  Orientation:  Full (Time, Place, and Person)  Thought Content:  WDL   Suicidal Thoughts:  No  Homicidal Thoughts:  No  Memory:  NA  Judgement:  Good  Insight:  Present  Psychomotor Activity:  Normal  Concentration:  Good  Recall:  Good  Akathisia:  No  Handed:  Right  AIMS (if indicated):     Assets:  Communication Skills Desire for Improvement Housing Physical Health Social Support Vocational/Educational  Sleep:  Number of Hours: 5.75   Current Medications: Current Facility-Administered Medications  Medication Dose Route Frequency Provider Last Rate Last Dose  . acetaminophen (TYLENOL) tablet 650 mg  650 mg Oral Q6H PRN Earney Navy, NP      . allopurinol (ZYLOPRIM) tablet 100 mg  100 mg Oral Daily Nanine Means, NP   100 mg at 08/07/13 0747  . alum & mag hydroxide-simeth (MAALOX/MYLANTA) 200-200-20 MG/5ML suspension 30 mL  30 mL Oral Q4H PRN Earney Navy, NP      . aspirin EC tablet 81 mg  81 mg Oral Daily Nanine Means, NP   81 mg at 08/07/13 0747  . chlordiazePOXIDE (LIBRIUM) capsule 25 mg  25 mg Oral Q6H PRN Earney Navy, NP      . chlordiazePOXIDE (LIBRIUM) capsule 25 mg  25 mg Oral TID Earney Navy, NP   25 mg at 08/07/13 1143   Followed by  . [START ON 08/08/2013] chlordiazePOXIDE (LIBRIUM) capsule 25 mg  25 mg Oral BH-qamhs Earney Navy, NP  Followed by  . [START ON 08/10/2013] chlordiazePOXIDE (LIBRIUM) capsule 25 mg  25 mg Oral Daily Earney Navy, NP      . clomiPHENE (CLOMID) tablet 25 mg  25 mg Oral Daily Nanine Means, NP      . gemfibrozil (LOPID) tablet 600 mg  600 mg Oral BID AC Nanine Means, NP   600 mg at 08/07/13 0602  . hydrOXYzine (ATARAX/VISTARIL) tablet 25 mg  25 mg Oral Q6H PRN Earney Navy, NP   25 mg at 08/06/13 2129  . influenza vac split quadrivalent PF (FLUARIX) injection 0.5 mL  0.5 mL Intramuscular Tomorrow-1000 Rachael Fee, MD      . lisinopril (PRINIVIL,ZESTRIL) tablet 20 mg  20 mg Oral Daily Nanine Means, NP   20 mg at 08/07/13 0747  . loperamide (IMODIUM) capsule  2-4 mg  2-4 mg Oral PRN Earney Navy, NP      . magnesium hydroxide (MILK OF MAGNESIA) suspension 30 mL  30 mL Oral Daily PRN Earney Navy, NP      . metoprolol succinate (TOPROL-XL) 24 hr tablet 25 mg  25 mg Oral Daily Nanine Means, NP   25 mg at 08/07/13 0747  . multivitamin with minerals tablet 1 tablet  1 tablet Oral Daily Earney Navy, NP   1 tablet at 08/07/13 0747  . nitroGLYCERIN (NITROSTAT) SL tablet 0.4 mg  0.4 mg Sublingual Q5 min PRN Nanine Means, NP      . ondansetron (ZOFRAN-ODT) disintegrating tablet 4 mg  4 mg Oral Q6H PRN Earney Navy, NP      . pantoprazole (PROTONIX) EC tablet 40 mg  40 mg Oral BID AC Nanine Means, NP   40 mg at 08/07/13 0602  . pneumococcal 23 valent vaccine (PNU-IMMUNE) injection 0.5 mL  0.5 mL Intramuscular Tomorrow-1000 Rachael Fee, MD      . prasugrel (EFFIENT) tablet 10 mg  10 mg Oral Daily Nanine Means, NP   10 mg at 08/07/13 0747  . sertraline (ZOLOFT) tablet 50 mg  50 mg Oral Daily Nanine Means, NP   50 mg at 08/07/13 0747  . simvastatin (ZOCOR) tablet 20 mg  20 mg Oral q1800 Nanine Means, NP   20 mg at 08/06/13 1621  . thiamine (B-1) injection 100 mg  100 mg Intramuscular Once Earney Navy, NP      . thiamine (VITAMIN B-1) tablet 100 mg  100 mg Oral Daily Earney Navy, NP   100 mg at 08/07/13 0747  . zolpidem (AMBIEN) tablet 5 mg  5 mg Oral QHS PRN Earney Navy, NP   5 mg at 08/06/13 2129    Lab Results: No results found for this or any previous visit (from the past 48 hour(s)).  Physical Findings: AIMS: Facial and Oral Movements Muscles of Facial Expression: None, normal Lips and Perioral Area: None, normal Jaw: None, normal Tongue: None, normal,Extremity Movements Upper (arms, wrists, hands, fingers): None, normal Lower (legs, knees, ankles, toes): None, normal, Trunk Movements Neck, shoulders, hips: None, normal, Overall Severity Severity of abnormal movements (highest score from questions above):  None, normal Incapacitation due to abnormal movements: None, normal Patient's awareness of abnormal movements (rate only patient's report): No Awareness, Dental Status Current problems with teeth and/or dentures?: No Does patient usually wear dentures?: No  CIWA:  CIWA-Ar Total: 0 COWS:  COWS Total Score: 3  Treatment Plan Summary: Daily contact with patient to assess and evaluate symptoms and progress in treatment Medication  management  Plan: 1. Continue crisis management and stabilization. 2. Medication management to reduce current symptoms to base line and improve patient's overall level of functioning 3. Treat health problems as indicated. 4. Develop treatment plan to decrease risk of relapse upon discharge and the need for     readmission. 5. Psycho-social education regarding relapse prevention and self care. 6. Health care follow up as needed for medical problems. 7. Continue home medications where appropriate. 8. Consider D/C in AM if no further problems. Patient notes he will attend IOP.  Medical Decision Making Problem Points:  Established problem, stable/improving (1) Data Points:  Review or order medicine tests (1)  I certify that inpatient services furnished can reasonably be expected to improve the patient's condition.   Rona Ravens. [place] RPAC 2:28 PM 08/07/2013  Reviewed the information documented and agree with the treatment plan.  Armand Preast,JANARDHAHA R. 08/07/2013 3:38 PM

## 2013-08-08 MED ORDER — HYDROXYZINE HCL 25 MG PO TABS: 25.0000 mg | ORAL_TABLET | Freq: Four times a day (QID) | ORAL | Status: DC | PRN

## 2013-08-08 MED ORDER — SERTRALINE HCL 50 MG PO TABS: 50.0000 mg | ORAL_TABLET | Freq: Every day | ORAL | Status: DC

## 2013-08-08 MED ORDER — PNEUMOCOCCAL VAC POLYVALENT 25 MCG/0.5ML IJ INJ
0.5000 mL | INJECTION | Freq: Once | INTRAMUSCULAR | Status: AC
  Administered 2013-08-08: 0.5 mL via INTRAMUSCULAR

## 2013-08-08 MED ORDER — INFLUENZA VAC SPLIT QUAD 0.5 ML IM SUSP
0.5000 mL | Freq: Once | INTRAMUSCULAR | Status: AC
  Administered 2013-08-08: 0.5 mL via INTRAMUSCULAR
  Filled 2013-08-08: qty 0.5

## 2013-08-08 MED ORDER — ASPIRIN EC 81 MG PO TBEC: 81.0000 mg | DELAYED_RELEASE_TABLET | Freq: Every day | ORAL | Status: AC

## 2013-08-08 MED ORDER — ALLOPURINOL 100 MG PO TABS: 100.0000 mg | ORAL_TABLET | Freq: Every day | ORAL | Status: DC

## 2013-08-08 MED ORDER — PANTOPRAZOLE SODIUM 40 MG PO TBEC: 40.0000 mg | DELAYED_RELEASE_TABLET | Freq: Every day | ORAL | Status: DC

## 2013-08-08 NOTE — Progress Notes (Signed)
Pt discharged per MD orders; pt currently denies SI/HI and auditory/visual hallucinations; pt was given education by RN regarding follow-up appointments and medications and pt denied any questions or concerns about these instructions; pt was then discharged to hospital lobby.

## 2013-08-08 NOTE — Progress Notes (Signed)
[facility] Adult Case Management Discharge Plan :  Will you be returning to the same living situation after discharge: Yes,  home with family At discharge, do you have transportation home?:Yes,  wife or son Do you have the ability to pay for your medications:Yes,  [employer], music.  Release of information consent forms completed and in the chart;  Patient's signature needed at discharge.  Patient to Follow up at: Follow-up Information   Follow up with [facility]-CD IOP/Psychiatry. Dewayne Hatch will call you directly to schedule assessment appt for CD IOP and med management. )    Contact information:   [address] [place], [place] [address] Phone: [phone] Fax: [phone]      Patient denies SI/HI:   Yes,  during admission, group, and self report.     Safety Planning and Suicide Prevention discussed:  Yes,  SPE not required for this pt as he did not endorse SI during admission or stay at [facility]. SPI pamphlet provided to pt and he was encouraged to share this information with support network.   Smart, Dashon Mcintire 08/08/2013, 10:34 AM

## 2013-08-08 NOTE — Progress Notes (Signed)
Patient did attend the evening speaker AA meeting.  
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
## 2013-08-08 NOTE — [facility] Suicide Risk Assessment (Signed)
[facility] INPATIENT: Family/Significant Other Suicide Prevention Education  Suicide Prevention Education:  Education Completed; No one has been identified by the patient as the family member/significant other with whom the patient will be residing, and identified as the person(s) who will aid the patient in the event of a mental health crisis (suicidal ideations/suicide attempt).   Pt did not c/o SI at admission, nor have they endorsed SI during their stay here. SPE not required. SPI pamphlet provided to pt and he was encouraged to ask questions, talk about concerns, and share information with his support network.  [employer], LCSWA 08/08/2013 10:32 AM

## 2013-08-08 NOTE — Progress Notes (Signed)
Patient ID: Sean Rivas, male   DOB: 04-27-1971, 42 y.o.   MRN: [id number] He has been up and to groups interacting with peers and staff. He denies SI thoughts. His plan upon discharge is to go to outpatient and to return to work. Stated that he plans to stop drinking because he was not going to lose everythink because of drinking.

## 2013-08-08 NOTE — [facility] Suicide Risk Assessment (Signed)
Suicide Risk Assessment  Discharge Assessment     Demographic Factors:  Male and Caucasian  Mental Status Per Nursing Assessment::   On Admission:     Current Mental Status by Physician: In full contact with reality. There are no suicidal ideas, plans or intent. His mood is euthymic, his affect is appropriate. He is willing and motivated to pursue further outpatient treatment. He states that coming here has been an eye opener. Can see if he does not do something about his drinking, he can end up like many of the patients here. He states that he has also realized he needs counseling, and he is ready to pursue it. He also realizes that if he does not do something about it, he can end up losing his job   Loss Factors: NA  Historical Factors: NA  Risk Reduction Factors:   Sense of responsibility to family, Employed, Living with another person, especially a relative and Positive social support  Continued Clinical Symptoms:  Depression:   Comorbid alcohol abuse/dependence Alcohol/Substance Abuse/Dependencies  Cognitive Features That Contribute To Risk:  Polarized thinking Thought constriction (tunnel vision)    Suicide Risk:  Minimal: No identifiable suicidal ideation.  Patients presenting with no risk factors but with morbid ruminations; may be classified as minimal risk based on the severity of the depressive symptoms  Discharge Diagnoses:   AXIS I:  Alcohol Dependence, Major Depressive Disorder AXIS II:  Deferred AXIS III:   Past Medical History  Diagnosis Date  . Coronary artery disease     A.  07/18/2004 - PCI/DES OM2: 2.5x13 Cypher DES Dist/3.0x13 Cypher Prox;   B.  06/2009 & 06/2011- NL Stress Echo EF 70-75%;  C. 09/21/2011 s/p STEMI 2/2 late stent thrombosis -  thrombectomy and PCI/DES 3.0 x 28 mm Promus  . Hyperlipidemia   . Hypertension   . GERD (gastroesophageal reflux disease)   . Tobacco abuse     24 pack year - 1ppd as of 09/21/2011   AXIS IV:  other  psychosocial or environmental problems AXIS V:  61-70 mild symptoms  Plan Of Care/Follow-up recommendations:  Activity:  as tolerated Diet:  regular Follow up a relapse prevention plan. CD-IOP, AA, counseling Is patient on multiple antipsychotic therapies at discharge:  No   Has Patient had three or more failed trials of antipsychotic monotherapy by history:  No Recommended Plan for Multiple Antipsychotic Therapies: NA  Sean Rivas A 08/08/2013, 12:23 PM

## 2013-08-08 NOTE — [facility] Group Notes (Signed)
[facility] LCSW Aftercare Discharge Planning Group Note   08/08/2013 9:29 AM  Participation Quality:  Appropriate   Mood/Affect:  Appropriate  Depression Rating:  2  Anxiety Rating:  0  Thoughts of Suicide:  No Will you contract for safety?   NA  Current AVH:  No  Plan for Discharge/Comments:  Pt to follow up with [facility] o/p for CDIOP/med management. Pt reports that he met with Dewayne Hatch a few days ago to discuss aftercare. Pt plans to return home to live with his wife and son.   Transportation Means: wife or son.   Supports: strong family supports identified Tour manager)  Counselling psychologist, Research scientist (physical sciences)

## 2013-08-08 NOTE — [facility] Group Notes (Signed)
[facility] LCSW Group Therapy  08/08/2013 3:20 PM  Type of Therapy:  Group Therapy  Participation Level:  Active  Participation Quality:  Attentive  Affect:  Appropriate  Cognitive:  Alert and Oriented  Insight:  Engaged  Engagement in Therapy:  Engaged  Modes of Intervention:  Confrontation, Discussion, Education, Socialization and Support  Summary of Progress/Problems: Today's Topic: Overcoming Obstacles. Pt identified obstacles faced currently and processed barriers involved in overcoming these obstacles. Pt identified steps necessary for overcoming these obstacles and explored motivation (internal and external) for facing these difficulties head on. Pt further identified one area of concern in their lives and chose a skill of focus pulled from their "toolbox." Sean Rivas was attentive and engaged throughout today's therapy group. Sean Rivas stated that his biggest obstacle involves "daily stress." Sean Rivas stated that drinking was his only way of coping with life stressors in the past. 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

## 2013-08-08 NOTE — Discharge Summary (Signed)
Physician Discharge Summary Note  Patient:  Sean Rivas is an 42 y.o., male MRN:  [id number] DOB:  30-Dec-1970 Patient phone:  [phone] (home)  Patient address:   [address] [place] [address],   Date of Admission:  08/05/2013 Date of Discharge: 08/08/2013  Reason for Admission:  Alcohol detox/dependency  Discharge Diagnoses: Principal Problem:   Substance induced mood disorder Active Problems:   Alcohol dependence   Major depressive disorder, recurrent episode, severe, without mention of psychotic behavior   Bereavement   Posttraumatic stress disorder  Review of Systems  Constitutional: Negative.   HENT: Negative.   Eyes: Negative.   Respiratory: Negative.   Cardiovascular: Negative.   Gastrointestinal: Negative.   Genitourinary: Negative.   Musculoskeletal: Negative.   Skin: Negative.   Neurological: Negative.   Endo/Heme/Allergies: Negative.   Psychiatric/Behavioral: Positive for substance abuse. The patient is nervous/anxious.     DSM5:  Trauma-Stressor Disorders:  Posttraumatic Stress Disorder (309.81) Substance/Addictive Disorders:  Alcohol Related Disorder - Severe (303.90) and Alcohol Intoxication with Use Disorder - Severe (F10.229) Depressive Disorders:  Major Depressive Disorder - Moderate (296.22)  Axis Diagnosis:   AXIS I:  Alcohol Abuse, Anxiety Disorder NOS, Major Depression, Recurrent severe and Post Traumatic Stress Disorder AXIS II:  Deferred AXIS III:   Past Medical History  Diagnosis Date  . Coronary artery disease     A.  07/18/2004 - PCI/DES OM2: 2.5x13 Cypher DES Dist/3.0x13 Cypher Prox;   B.  06/2009 & 06/2011- NL Stress Echo EF 70-75%;  C. 09/21/2011 s/p STEMI 2/2 late stent thrombosis -  thrombectomy and PCI/DES 3.0 x 28 mm Promus  . Hyperlipidemia   . Hypertension   . GERD (gastroesophageal reflux disease)   . Tobacco abuse     24 pack year - 1ppd as of 09/21/2011   AXIS IV:  other psychosocial or environmental  problems, problems related to social environment and problems with primary support group AXIS V:  61-70 mild symptoms  Level of Care:  IOP  Hospital Course:  On admission:  Patient has had multiple family losses since January---uncle, father, brother 41 yo), and grandmother. He found his father dead after he had been dead for 2-3 days from alcohol and pill overdose. He has been making mistakes at work and took [employer]. Aalijah started self-medicating with alcohol. Initially he started drinking to help him sleep, increased to the point of drinking all day to cope. His father was an alcoholic.   During hospitalization:  [facility] alcohol detox protocol implemented successfully.  Medical medications from home were continued including his Zoloft 50 mg daily for depression.  Atarax 25 mg every six hours PRN anxiety was started and Ambien 5 mg PRN sleep during inpatient use only.  Faylene Million attended and participated in therapy.  He denied suicidal/homicidal ideations and auditory/visual hallucinations, follow-up appointments encouraged to attend, outside support groups encouraged and information given, Rx given.  Shadrack is mentally and physically stable for discharge and will continue his sobriety at IOP at [facility].  Consults:  None  Significant Diagnostic Studies:  labs: completed, reviewed, stable  Discharge Vitals:   Blood pressure 120/81, pulse 66, temperature 97.4 F (36.3 C), temperature source Oral, resp. rate 16, height 5\' 10"  (1.778 m), weight 99.791 kg (220 lb), SpO2 99.00%. Body mass index is 31.57 kg/(m^2). Lab Results:   No results found for this or any previous visit (from the past 72 hour(s)).  Physical Findings: AIMS: Facial and Oral Movements Muscles of Facial Expression: None, normal  Lips and Perioral Area: None, normal Jaw: None, normal Tongue: None, normal,Extremity Movements Upper (arms, wrists, hands, fingers): None, normal Lower (legs, knees, ankles, toes): None, normal, Trunk  Movements Neck, shoulders, hips: None, normal, Overall Severity Severity of abnormal movements (highest score from questions above): None, normal Incapacitation due to abnormal movements: None, normal Patient's awareness of abnormal movements (rate only patient's report): No Awareness, Dental Status Current problems with teeth and/or dentures?: No Does patient usually wear dentures?: No  CIWA:  CIWA-Ar Total: 0 COWS:  COWS Total Score: 3  Psychiatric Specialty Exam: See Psychiatric Specialty Exam and Suicide Risk Assessment completed by Attending Physician prior to discharge.  Discharge destination:  Home  Is patient on multiple antipsychotic therapies at discharge:  No   Has Patient had three or more failed trials of antipsychotic monotherapy by history:  No  Recommended Plan for Multiple Antipsychotic Therapies: NA     Medication List    ASK your doctor about these medications     Indication   allopurinol 100 MG tablet  Commonly known as:  ZYLOPRIM  Take 100 mg by mouth daily.      aspirin EC 81 MG tablet  Take 81 mg by mouth daily.      clomiPHENE 50 MG tablet  Commonly known as:  CLOMID  Take 12.5 mg by mouth daily.      gemfibrozil 600 MG tablet  Commonly known as:  LOPID  Take 600 mg by mouth 2 (two) times daily before a meal.      lisinopril 20 MG tablet  Commonly known as:  PRINIVIL,ZESTRIL  Take 20 mg by mouth daily.      LORazepam 1 MG tablet  Commonly known as:  ATIVAN  Take 1 tablet (1 mg total) by mouth every 8 (eight) hours as needed.      metoprolol succinate 25 MG 24 hr tablet  Commonly known as:  TOPROL XL  Take 1 tablet (25 mg total) by mouth daily.      metoprolol succinate 25 MG 24 hr tablet  Commonly known as:  TOPROL-XL  Take 25 mg by mouth daily.      nitroGLYCERIN 0.4 MG SL tablet  Commonly known as:  NITROSTAT  Place 1 tablet (0.4 mg total) under the tongue every 5 (five) minutes as needed. For chest pain      pantoprazole 40 MG  tablet  Commonly known as:  PROTONIX  Take 40 mg by mouth daily.      prasugrel 10 MG Tabs tablet  Commonly known as:  EFFIENT  Take 1 tablet (10 mg total) by mouth daily.      sertraline 50 MG tablet  Commonly known as:  ZOLOFT  Take 50 mg by mouth daily.      sildenafil 50 MG tablet  Commonly known as:  VIAGRA  Take 1 tablet (50 mg total) by mouth daily as needed for erectile dysfunction.      simvastatin 80 MG tablet  Commonly known as:  ZOCOR  Take 80 mg by mouth at bedtime.            Follow-up Information   Follow up with [facility]. Dewayne Hatch will call you directly to schedule assessment appt for [facility] IOP and med management. )    Contact information:   [address] [place], [place] [address] Phone: [phone] Fax: [phone]      Follow-up recommendations:  Activity:  as tolerated Diet:  low-sodium heart healthy diet Continue to work  your relapse prevention plan Continue to work on the life style changes that could better help manage your mood and anxiety symptoms Comments:  Patient will continue his care at [facility]-IOP  Total Discharge Time:  Greater than 30 minutes.  SignedNanine Means, PMH-NP 08/08/2013, 12:22 PM Agree with assessment and plan Madie Reno A. Dub Mikes, M.D.

## 2013-08-08 NOTE — Progress Notes (Signed)
Psychoeducational Group Note  Date:  08/08/2013 Time:  1100  Group Topic/Focus:  Self Care:   The focus of this group is to help patients understand the importance of self-care in order to improve or restore emotional, physical, spiritual, interpersonal, and financial health.  Participation Level: Did Not Attend  Participation Quality:  Not Applicable  Affect:  Not Applicable  Cognitive:  Not Applicable  Insight:  Not Applicable  Engagement in Group: Not Applicable  Additional Comments:  Patient did not attend group.  Karleen Hampshire Brittini 08/08/2013, 7:12 PM

## 2013-08-09 ENCOUNTER — Encounter (HOSPITAL_COMMUNITY): Payer: Self-pay | Admitting: Psychology

## 2013-08-10 ENCOUNTER — Other Ambulatory Visit (HOSPITAL_COMMUNITY): Payer: 59 | Attending: Psychiatry | Admitting: Psychology

## 2013-08-10 DIAGNOSIS — I2510 Atherosclerotic heart disease of native coronary artery without angina pectoris: Secondary | ICD-10-CM

## 2013-08-10 DIAGNOSIS — Z634 Disappearance and death of family member: Secondary | ICD-10-CM

## 2013-08-10 DIAGNOSIS — F4310 Post-traumatic stress disorder, unspecified: Secondary | ICD-10-CM | POA: Insufficient documentation

## 2013-08-10 DIAGNOSIS — F411 Generalized anxiety disorder: Secondary | ICD-10-CM | POA: Insufficient documentation

## 2013-08-10 DIAGNOSIS — F332 Major depressive disorder, recurrent severe without psychotic features: Secondary | ICD-10-CM

## 2013-08-10 DIAGNOSIS — F1020 Alcohol dependence, uncomplicated: Secondary | ICD-10-CM

## 2013-08-10 DIAGNOSIS — F1010 Alcohol abuse, uncomplicated: Secondary | ICD-10-CM | POA: Insufficient documentation

## 2013-08-10 NOTE — Progress Notes (Signed)
Patient Discharge Instructions:  Next Level Care Provider Has Access to the EMR, 08/10/13 Records provided to [facility] via [facility]/Epic access.  Jerelene Redden, 08/10/2013, 3:59 PM

## 2013-08-11 ENCOUNTER — Encounter (HOSPITAL_COMMUNITY): Payer: Self-pay | Admitting: Psychology
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
## 2013-08-12 ENCOUNTER — Other Ambulatory Visit (HOSPITAL_COMMUNITY): Payer: 59 | Attending: Psychiatry | Admitting: Psychology

## 2013-08-12 DIAGNOSIS — F332 Major depressive disorder, recurrent severe without psychotic features: Secondary | ICD-10-CM | POA: Insufficient documentation

## 2013-08-12 DIAGNOSIS — F4310 Post-traumatic stress disorder, unspecified: Secondary | ICD-10-CM | POA: Insufficient documentation

## 2013-08-12 DIAGNOSIS — F1020 Alcohol dependence, uncomplicated: Secondary | ICD-10-CM

## 2013-08-12 DIAGNOSIS — I2510 Atherosclerotic heart disease of native coronary artery without angina pectoris: Secondary | ICD-10-CM

## 2013-08-14 NOTE — ED Provider Notes (Signed)
Medical screening examination/treatment/procedure(s) were performed by non-physician practitioner and as supervising physician I was immediately available for consultation/collaboration.  EKG Interpretation   None        Sayvon Arterberry T Miner Koral, MD 08/14/13 2123 

## 2013-08-15 ENCOUNTER — Encounter (HOSPITAL_COMMUNITY): Payer: Self-pay | Admitting: Psychology

## 2013-08-15 ENCOUNTER — Other Ambulatory Visit (HOSPITAL_COMMUNITY): Payer: 59 | Attending: Psychiatry | Admitting: Psychology

## 2013-08-15 DIAGNOSIS — I2510 Atherosclerotic heart disease of native coronary artery without angina pectoris: Secondary | ICD-10-CM

## 2013-08-15 DIAGNOSIS — F332 Major depressive disorder, recurrent severe without psychotic features: Secondary | ICD-10-CM

## 2013-08-15 DIAGNOSIS — F1020 Alcohol dependence, uncomplicated: Secondary | ICD-10-CM

## 2013-08-15 DIAGNOSIS — F4310 Post-traumatic stress disorder, unspecified: Secondary | ICD-10-CM | POA: Insufficient documentation

## 2013-08-15 NOTE — Progress Notes (Signed)
    Daily Group Progress Note  Program: CD-IOP   Group Time: 1-2:30 pm  Participation Level: Active  Behavioral Response: Appropriate and Sharing  Type of Therapy: Process Group  Topic: Group members introduced themselves and provided their sobriety dates.  Each group member had a chance to check in about how they were doing.  One group member reported a new sobriety date, and the group processed his relapse, provided him with encouragement, and commended his honesty.  Another group member shared that she was struggling with cravings, and the group provided her with suggestions about how to handle them.  The group also briefly discussed the supports worksheet from the previous session.  Group Time: 2:45- 4pm  Participation Level: Minimal  Behavioral Response: Sharing  Type of Therapy: Psycho-education Group  Topic: A pharmacist from the inpatient unit visited the group and provided education about psychotropic medications and how they can help people with substance use disorders.  She explained the effects of drug use on the central nervous system and described the healing process the brain goes through during early recovery.  She described several different types of psychotropic medications and provided the group with ideas about alternatives to addictive drugs such as benzodiazepines and opiates.  Group members asked questions about their own medications.  Group members reported after her presentation that they had learned a lot and enjoyed hearing from her.  Summary: Patient reported a sobriety date of 11/7. He shared that he was feeling motivated to stay sober and was excited about his future. He had not experienced cravings for alcohol yet, but noted that he had been craving sweets and eating a lot of candy.  He shared that exercising at the gym was a good stress reliever for him.  During the psychoeducation presentation, he asked the pharmacist about how his medications might affect  his heart condition.  She also provided him with information about Ambien and sleep, since he used to take that medication.  Her presentation helped normalize his experience with combining Ambien and alcohol. The patient responded well to this intervention.    Family Program: Family present? No   Name of family member(s):   UDS collected: Yes Results:not back yet  AA/NA attended?: No , he is new to the program, but reported intention to appear for AA meeting on Saturday  Sponsor?: No   Zasha Belleau, LCAS

## 2013-08-16 ENCOUNTER — Encounter (HOSPITAL_COMMUNITY): Payer: Self-pay | Admitting: Psychology
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
## 2013-08-17 ENCOUNTER — Other Ambulatory Visit (HOSPITAL_COMMUNITY): Payer: 59 | Attending: Psychiatry | Admitting: Psychology

## 2013-08-17 DIAGNOSIS — F10239 Alcohol dependence with withdrawal, unspecified: Secondary | ICD-10-CM

## 2013-08-17 DIAGNOSIS — I25119 Atherosclerotic heart disease of native coronary artery with unspecified angina pectoris: Secondary | ICD-10-CM

## 2013-08-17 DIAGNOSIS — F332 Major depressive disorder, recurrent severe without psychotic features: Secondary | ICD-10-CM
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
## 2013-08-19 ENCOUNTER — Other Ambulatory Visit (HOSPITAL_COMMUNITY): Payer: 59 | Attending: Psychiatry | Admitting: Psychology

## 2013-08-19 ENCOUNTER — Encounter (HOSPITAL_COMMUNITY): Payer: Self-pay | Admitting: Psychology

## 2013-08-19 DIAGNOSIS — F332 Major depressive disorder, recurrent severe without psychotic features: Secondary | ICD-10-CM

## 2013-08-19 DIAGNOSIS — Z634 Disappearance and death of family member: Secondary | ICD-10-CM

## 2013-08-19 DIAGNOSIS — F1020 Alcohol dependence, uncomplicated: Secondary | ICD-10-CM

## 2013-08-19 DIAGNOSIS — I2510 Atherosclerotic heart disease of native coronary artery without angina pectoris: Secondary | ICD-10-CM
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
## 2013-08-19 NOTE — Progress Notes (Signed)
    Daily Group Progress Note  Program: CD-IOP   Group Time: 1-2:30 pm  Participation Level: Active  Behavioral Response: Appropriate and Sharing  Type of Therapy: Process Group  Topic: Group Process; the first part of group was spent in process. A new group member was present today and he shared a little about himself. One member admitted she was anxious because she has just realized how powerful this disease really is. She had been going to meetings and working her daily recovery, but she admitted that it is kind of scary for her. Other group members shared about this issue and others in early recovery. A member was asked to read the 'Thought for the Day' and it addressed this very issue and the importance of reaching out for support. There was good feedback and disclosure among members.   Group Time: 2:45- 4pm  Participation Level: Active  Behavioral Response: Appropriate and Sharing  Type of Therapy: Psycho-education Group  Topic: Psycho-Ed/Graduation: Handout on "I am Your Disease". Group members passed around a handout and then took turns reading it. It proved very powerful and tied in well with the previous session's conversations. At the conclusion of this session, members observed a graduation. One of the members was leaving after today, having completed the program successfully. There were kind words of hope and encouragement and he seemed genuinely touched by the emotion shared by his fellow group members.   Summary: the patient arrived and reported he was feeling good. He had worked out twice since the last group session.    Family Program: Family present? No   Name of family member(s):   UDS collected: Yes Results: positive for Librium - from his alcohol detox at [facility]  AA/NA attended?: YesSaturday  Sponsor?: No   Greta Yung, LCAS

## 2013-08-22 ENCOUNTER — Other Ambulatory Visit (HOSPITAL_COMMUNITY): Payer: 59 | Attending: Psychiatry | Admitting: Psychology

## 2013-08-22 ENCOUNTER — Encounter (HOSPITAL_COMMUNITY): Payer: Self-pay | Admitting: Psychology

## 2013-08-22 DIAGNOSIS — F1020 Alcohol dependence, uncomplicated: Secondary | ICD-10-CM | POA: Diagnosis present

## 2013-08-22 DIAGNOSIS — Z634 Disappearance and death of family member: Secondary | ICD-10-CM

## 2013-08-22 DIAGNOSIS — F332 Major depressive disorder, recurrent severe without psychotic features: Secondary | ICD-10-CM

## 2013-08-22 DIAGNOSIS — I2510 Atherosclerotic heart disease of native coronary artery without angina pectoris: Secondary | ICD-10-CM

## 2013-08-22 NOTE — Progress Notes (Signed)
    Daily Group Progress Note  Program: CD-IOP   Group Time: 1-2:30 pm  Participation Level: Active  Behavioral Response: Sharing  Type of Therapy: Process Group  Topic: Group Process: the first part of group was spent in process. Members shared about current issues and concerns in early recovery. They were also asked to check-in with sobriety date and what they had done since the last session to support their recovery. There as a good discussion and different ideas and suggests made among members. During this session, the PA, CK, was meeting with new patients for the first time.   Group Time: 2:45- 4pm  Participation Level: Active  Behavioral Response: Sharing  Type of Therapy: Psycho-education Group  Topic: Relapse Prevention: the second half of group was spent in a psycho-ed on relapse prevention. A segment from the [employer] Addiction DVD was played and it explained the chemical changes that occur in the brain when one is exposed to 'triggers'. the changes that occur, including the release of dopamine, was highlighted and provided the group members with a clearer understanding of how cravings are generated. The ensuing discussion focused on how these members could avoid those situations and circumstances that generate dopamine discharge and lead to cravings. As the session neared the end, each group member shared his/her plans for the weekend and how they intended to remain abstinent and support their recovery. The session proved engaging with every member providing input and sharing strategies.   Summary: The patient reported he had felt very tired yesterday and just stayed at home and rested. He wondered whether the alcohol is still coming out of his body and another member suggested that the toxins are still coming out of his system. I reminded him that he has been working out very hard over the last 10 days or so and I encouraged him to rest and learn to pace himself. The patient meant  with the medical director during the first half of group today. This was his first session with the medical director since joining the program. In the second half of group, the patient shared about his increasing alcohol use and how it impacted his daily life. He described how he had worked for 5 years at his current employer, but had never been written up. In the past 6 months he had been written up 5 times. He admitted it was because he had grown more depressed and his drinking had increased markedly. The patient is new to recovery, but displays good insight and makes excellent comments in the sessions. His sobriety date remains 11/7.   Family Program: Family present? No   Name of family member(s):   UDS collected: No Results:   AA/NA attended?: YesThursday  Sponsor?: No   Maridee Slape, LCAS

## 2013-08-23 ENCOUNTER — Encounter (HOSPITAL_COMMUNITY): Payer: Self-pay | Admitting: Psychology

## 2013-08-23 NOTE — Progress Notes (Signed)
Daily Group Progress Note  Program: CD-IOP   Group Time: 1-2:30 pm  Participation Level: Active  Behavioral Response: Rigid, Rationalizing and Resistant  Type of Therapy: Process Group  Topic: Group Process: the first part of group was spent in process. Members shared about the past weekend and things they do to support their recovery. Two new group members were present and introduced themselves over the course of their first group sessions. At least two members seemed to be 'making excuses' for not attending any 12-Step meetings and they were challenged about this. Other members emphasized how much the Fellowship has helped them. Prior to the break, members were given copies of "The Feeling Wheel" and were asked to identify the feelings they were aware of at this moment. Some have a difficult time doing this while others identify up to 10 feelings listened on the wheel. There was good discussion and feedback among the group.   Group Time: 2:45- 4pm  Participation Level: Minimal  Behavioral Response: Sharing  Type of Therapy: Psycho-education Group  Topic: Triggers and Relapse: a Slide Show was presented in the second half of group. This presentation, from [facility], identified the process of addiction through the reinforcement that comes through continued use. Members shared their experiences and provided examples as the slide show progressed. The importance of understanding how triggers develop was emphasized. Eliminating triggers when possible and then learning to deal more effectively with feelings that are triggered was emphasized as a significant part of recovery. The session invited feedback and discussion among members.   Summary: The patient reported he had felt very tired this weekend. He attributed this to a long period of drinking. He admitted he feels exhausted emotionally and physically. He shared that he had agreed to go with his wife and do all of their  christmas shopping, but admitted he had not attended any 12-step meetings. I reminded him of the importance of meetings and building support in the Fellowship. He responded that he wasn't going to let meetings interfere with priorities around his family and their needs. I reminded him that there were a number of meetings around throughout the day and going to an evening meeting might not be the best one, but that he needed to find some meetings. He quoted his mother as saying her husband hadn't gone to any meetings in years because he didn't want to listen to a bunch of old men complaining. He reminded the group about the last meeting he had attended. Another member questioned him and stated, "you mean you are still carrying that around with you". I explained that at meetings one must listen to the 'recovery talk' and not the addictive talk and that if they had a bad experience, attend a different meeting. The patient made a lot of excuses and it appeared quite obvious to the other group members. Almost everyone noted how the meetings had proven very helpful, with one member stating that, "if it wasn't for the AA meetings I wouldn't be sober today". I invited this patient to share what he was feeling, because I could see that he wasn't laughing and seemed to be growing more resistant and angry about my earlier challenge. He went on and made more excuses and couldn't seem to leave the topic. He reported he was okay and wasn't taking it personally. The patient pointedo ut some instances where he could relate to the slide show and agreed with others about the symptoms and characteristics displayed in  their active addiction. He made some good comments and his sobriety date remains 11/7.    Family Program: Family present? No   Name of family member(s):   UDS collected: Yes Results: not returned  AA/NA attended?: No, none since last Saturday  Sponsor?: No   Givanni Staron, LCAS

## 2013-08-24 ENCOUNTER — Other Ambulatory Visit (HOSPITAL_COMMUNITY): Payer: 59 | Attending: Psychiatry | Admitting: Psychology

## 2013-08-24 DIAGNOSIS — F1020 Alcohol dependence, uncomplicated: Secondary | ICD-10-CM

## 2013-08-24 DIAGNOSIS — F332 Major depressive disorder, recurrent severe without psychotic features: Secondary | ICD-10-CM

## 2013-08-24 DIAGNOSIS — I2510 Atherosclerotic heart disease of native coronary artery without angina pectoris: Secondary | ICD-10-CM

## 2013-08-26 ENCOUNTER — Other Ambulatory Visit (HOSPITAL_COMMUNITY): Payer: 59

## 2013-08-28 ENCOUNTER — Encounter (HOSPITAL_COMMUNITY): Payer: Self-pay | Admitting: Psychology

## 2013-08-28 NOTE — Progress Notes (Signed)
    Daily Group Progress Note  Program: CD-IOP   Group Time: 1-2:30 pm  Participation Level: Active  Behavioral Response: Appropriate and Sharing  Type of Therapy: Process Group  Topic:Group Process/Introduction: The first part of group was spent in process. Members shared about current struggles and issues. A new member was present and during this session, she introduced herself and explained why she was here. There was god feedback ands support. There were a number of people in emotional pain and the group was very gentle and validating.    Group Time: 2:45- 4pm  Participation Level: Active  Behavioral Response: Sharing  Type of Therapy: Psycho-education Group  Topic: Feelings/Holiday Plans: The second half of group was spent in a psycho-ed session about the importance of expressing one's feelings. The Feeling Wheel was described and members shared their feelings. A discussion of member's plans over this long holiday weekend were discussed and the importance of making plans and following through with them was emphasized. The session was intense with feedback a depth of sharing witnessed among the group.   Summary: The patient reported he is doing really well. He admitted he is feeling very positive and inspired by this group. The patient noted that after speaking at length with his wife, he went online and is considering getting his undergraduate degree online. He admitted he would like to be a substance abuse counselor. "If I can help one person, I will have done something good in my life", he told his fellow group members. The patient provided support and kind words to the male member who is struggling in his marriage. He identified hope and motivation and his feelings. The patient continues to respond well to group therapy. His sobriety date remains 11/7.   Family Program: Family present? No   Name of family member(s):   UDS collected: No Results:   AA/NA attended?: No  Sponsor?: No   Farris Blash, LCAS

## 2013-08-29 ENCOUNTER — Other Ambulatory Visit (HOSPITAL_COMMUNITY): Payer: 59 | Attending: Psychiatry | Admitting: Psychology

## 2013-08-29 ENCOUNTER — Encounter (HOSPITAL_COMMUNITY): Payer: Self-pay | Admitting: Psychology

## 2013-08-29 DIAGNOSIS — F332 Major depressive disorder, recurrent severe without psychotic features: Secondary | ICD-10-CM

## 2013-08-29 DIAGNOSIS — F1020 Alcohol dependence, uncomplicated: Secondary | ICD-10-CM

## 2013-08-29 DIAGNOSIS — I2510 Atherosclerotic heart disease of native coronary artery without angina pectoris: Secondary | ICD-10-CM

## 2013-08-29 DIAGNOSIS — Z634 Disappearance and death of family member: Secondary | ICD-10-CM

## 2013-08-30 ENCOUNTER — Encounter (HOSPITAL_COMMUNITY): Payer: Self-pay | Admitting: Psychology

## 2013-08-30 NOTE — Progress Notes (Signed)
    Daily Group Progress Note  Program: CD-IOP   Group Time: 1:00-2:30pm  Participation Level: Active  Behavioral Response: Appropriate and Sharing  Type of Therapy: Process Group  Topic: Counselor opened the group by having group members check in, sharing their names and sobriety dates.  Counselor then invited each group member share about what they did over the holiday weekend and how they maintained their sobriety.  The group processed one member's comment that he had not attended any AA or NA meetings for the last 6 days, with several members providing feedback and encouraging him to get back into the habit.  The group also discussed triggers associated with the holidays, particularly traveling to places where they used to use and being around family.  Counseling intern also led group in a discussion on the personal bill of rights, and several group members shared which rights felt especially relevant or meaningful to them.   Group Time: 2:45-4:00  Participation Level: Active  Behavioral Response: Sharing and Monopolizing  Type of Therapy: Psycho-education Group  Topic: Counseling intern and counselor led group in a discussion on assertive communication.  The group discussed thoughts, feelings, and behaviors associated with being passive, aggressive, passive-aggressive, and assertive.  They also read about specific communication skills that are part of being assertive.  Two group members shared examples of times they had been assertive, and described the outcomes of those events, noting that they were generally positive, and in one instance the other person involved treated the group member with more respect afterward.  Group members discussed personal barriers toward being assertive, the impact of passive-aggressive behavior, and how they can practice being more assertive.  Summary: Patient reported a sobriety date of 11/7.  Patient reported that he had had a busy week with his  family for the holiday.  He shared that because he knew his mother would be difficult to deal with, he avoided her when possible.  Over the weekend, he attended an AA meeting with his wife, and helped with preparations (decorating and shopping) for Christmas.  He stated that he did not want to drink anymore, and shared that he was feeling grateful to be sober, and peaceful. He recognized that these positive feelings might not last.  During the psychoeducation group, he described his mother's passive-aggressive behavior and shared about how it impacts him.   Family Program: Family present? No   Name of family member(s): N/A  UDS collected: No Results:N/A  AA/NA attended?: Yes Saturday  Sponsor?: No   [facility]

## 2013-08-30 NOTE — Progress Notes (Unsigned)
Patient ID: Sean Rivas, male   DOB: 22-Aug-1971, 42 y.o.   MRN: [id number] CD-IOP: Individual Therapy Session. I met with the patient this morning as scheduled. He reported a "great holiday" weekend. The patient has remained sober and noted he had attended an AA meeting with his wife on Saturday morning and it had been good for both of them. When asked about his work, the patient reported he had received his first check and was pleased. He continues to go to the gym regularly and is feeling very good about himself. The patient reported he had met with his PCP earlier this morning and his physician had applauded this patient's honesty and efforts in early recovery. Before the session ended, the patient and physician had prayed together. The patient admitted he feels an 'overwhelming peace" right now. I encouraged him to savor it because it will pass. The patient shared that he continues to explore his education options after he recognized he wanted to return to school and complete his bachelors' degree. He has sought information from "[facility]" and noted the online program was about $10,000 per semester. I admittedly encouraged him to explore other options, but he seemed very content with the cost of the schooling and pleased that it would be entirely online. He agreed to speak with me before he makes any final commitment. We agreed that I will continue to invite him to share his feelings and work on becoming more open and assertive in his communication patterns. The patient is doing very well - far better than I would have thought - and his sobriety date remains 11/7. The patient's drug tests are negative with the exception of Librium , which remains in his system from his detox upstairs. We will continue to follow closely in the days ahead.

## 2013-08-31 ENCOUNTER — Other Ambulatory Visit (HOSPITAL_COMMUNITY): Payer: 59 | Admitting: Psychology

## 2013-08-31 DIAGNOSIS — F332 Major depressive disorder, recurrent severe without psychotic features: Secondary | ICD-10-CM

## 2013-08-31 DIAGNOSIS — F1020 Alcohol dependence, uncomplicated: Secondary | ICD-10-CM

## 2013-08-31 DIAGNOSIS — Z634 Disappearance and death of family member: Secondary | ICD-10-CM

## 2013-09-01 ENCOUNTER — Encounter (HOSPITAL_COMMUNITY): Payer: Self-pay | Admitting: Psychology

## 2013-09-01 NOTE — Progress Notes (Signed)
    Daily Group Progress Note  Program: CD-IOP   Group Time: 1-2:30 pm  Participation Level: Active  Behavioral Response: Appropriate and Sharing  Type of Therapy: Process Group  Topic: Group Process: the first part of group was spent in process. Members shared about issues and concerns in early recovery. One member shared that she has been struggling with boredom. Another member had attended her first AA meeting and was warmly greeted by the women present. Another member noted he had reached 90 days today. The group applauded this news. There was good disclosure and support provided among the group. Also present were two students from the [facility] program.   Group Time: 2:45- 4pm  Participation Level: Active  Behavioral Response: Sharing  Type of Therapy: Psycho-education Group  Topic: "The Feeling Chart": the second part of group was spent in a psycho-ed piece with the handout, "The Feeling Chart". the topic was how one progresses in their drug use and eventually becomes hooked 'emotionally' on the chemicals. Parts of the handout were read by group members and they shared how they had experienced each of the stages of use identified in the handout. Many could relate to how they experienced less euphoria and much pain as their use increased. As the session neared the end, one member was expressed her fears and loneliness. I reminded her that this group is a 'kind of' family and she should seek out support through them. Group members responded to this invitation and offered to provide support in the week her parents are gone. This disclosure and response brought the group closer and the camaraderie was more evident as the session came to an end.   Summary: The patient reported he was doing well and is going to begin taking classes tomorrow. He explained he decided to register for classes at the local community college. He can take classes there before he goes on for further  education. Another member noted she had thought about him and wished him a belated birthday. Yesterday he had turned 42 yo. He reported a quiet day. While other members were talking about how devastating their parent's infidelity had been for them, the patient pointed out he would have been surprised if his parents had not been cheating on each other. There were considerably different experiences in the childhoods of these group members. The patient shared about his drinking and efforts to numb his feelings. He also didn't eat much, like many of the other members had shared. The patient is making good progress, but continues to resist attending 12-step meetings. His sobriety date is 11/7.     Family Program: Family present? No   Name of family member(s):   UDS collected: No Results:   AA/NA attended?: No  Sponsor?: No, patient is being resistant and will be reminded that to remain compliant in the program, he must attend 4 meetings per week   Tiyah Zelenak, LCAS

## 2013-09-02 ENCOUNTER — Other Ambulatory Visit (HOSPITAL_COMMUNITY): Payer: 59 | Admitting: Psychology

## 2013-09-02 DIAGNOSIS — F1020 Alcohol dependence, uncomplicated: Secondary | ICD-10-CM

## 2013-09-05 ENCOUNTER — Encounter (HOSPITAL_COMMUNITY): Payer: Self-pay | Admitting: Psychology

## 2013-09-05 ENCOUNTER — Other Ambulatory Visit (HOSPITAL_COMMUNITY): Payer: 59 | Admitting: Psychology

## 2013-09-05 DIAGNOSIS — F332 Major depressive disorder, recurrent severe without psychotic features: Secondary | ICD-10-CM

## 2013-09-05 DIAGNOSIS — I2510 Atherosclerotic heart disease of native coronary artery without angina pectoris: Secondary | ICD-10-CM

## 2013-09-05 DIAGNOSIS — F1020 Alcohol dependence, uncomplicated: Secondary | ICD-10-CM

## 2013-09-05 NOTE — Progress Notes (Signed)
    Daily Group Progress Note  Program: CD-IOP   Group Time: 1:00-2:30pm  Participation Level: Active  Behavioral Response: Sharing and Agitated  Type of Therapy: Process Group  Topic:   Group members checked in by sharing their names and sobriety dates, then counselor invited them to share about whatever was on their minds.  Group members discussed topics such as self-forgiveness, finishing what they start, and how perfectionism had affected their lives.  When one group member shared that he had attended very few meetings, counselor reviewed the program policy of attending 4 AA/NA meetings per week.  The group member became angry and upset and group members provided feedback about their experiences with meetings, including hesitation to attend and taking time to find groups that fit well.  Group members also provided the group member with feedback about how he presented himself.  Group Time: 2:45-4:00pm  Participation Level: Minimal  Behavioral Response: Agitated  Type of Therapy: Psycho-education Group  Topic:  Group members read a handout about resentments and discussed the resentments they are carrying, as well as resentments they have let go of in the past.  Counselor and counseling intern discussed the role of expectations and lack of communication in creating resentments.  One group member became emotional as she shared about her resentment toward her parents and the way she was treated growing up.  The group provided her with good feedback and encouragement, and thanked her for opening up to the group.  Counselor closed the group session by having each member share their weekend plans.   Summary: Patient reported a sobriety date of 11/7.  He shared that he felt like he was "playing catch up" by trying to fix up things around the house, starting an online class, and preparing for his grandson's birthday party.  He shared that he had not attended another meeting that week, and  counselor reminded him (and the rest of the group) about the program policy of attending 4 AA/NA meetings per week.  Patient became angry and expressed his frustration with this policy, and counselor and the group processed this with him, encouraging him to keep trying meetings at least for the duration of treatment.  One group member confronted him, saying that by comparing his situation to others', he was putting people down.  During the psychoeducation group, patient remained very disconnected from the rest of the group, participating minimally.It remains to be seen whether this member will return to the program. He over-reacted to comments made during this session and may find it difficult to return after being confronted in this session.    Family Program: Family present? No   Name of family member(s): n/a  UDS collected: No Results: n/a  AA/NA attended?: No  Sponsor?: No   [facility]

## 2013-09-07 ENCOUNTER — Other Ambulatory Visit (HOSPITAL_COMMUNITY): Payer: 59 | Admitting: Psychology

## 2013-09-07 ENCOUNTER — Encounter (HOSPITAL_COMMUNITY): Payer: Self-pay | Admitting: Psychology

## 2013-09-07 DIAGNOSIS — F332 Major depressive disorder, recurrent severe without psychotic features: Secondary | ICD-10-CM

## 2013-09-07 DIAGNOSIS — F1020 Alcohol dependence, uncomplicated: Secondary | ICD-10-CM

## 2013-09-07 DIAGNOSIS — I2510 Atherosclerotic heart disease of native coronary artery without angina pectoris: Secondary | ICD-10-CM

## 2013-09-07 NOTE — Progress Notes (Signed)
    Daily Group Progress Note  Program: CD-IOP   Group Time: 1-2:30 pm  Participation Level: Active  Behavioral Response: Sharing  Type of Therapy: Process Group  Topic: Group process: the first part of group was spent in process. Members shared about the past weekend and any issues or concerns that had presented themselves during this time. No one had relapsed and most member shared bout active engagement in the [employer]. One member admitted he had not attended any meetings and made a number or excuses about why he had not gone. Other members presented their own experiences and it proved to be a very emotional session. There was good disclosure among members.  Group Time: 2:45- 4pm  Participation Level: Minimal  Behavioral Response: appropriate  Type of Therapy: Psycho-education Group  Topic: Psycho-ed: the second part of group was spent in a psycho-ed about resentments. This session was the follow-up to the session on Friday. Members provided feedback about the handout on the benefits and losses of resentments. The session was intense with members disclosing the benefits of resentments that were not pretty. It was a very profound session with good sharing and feedback among the group.   Summary:The patient reported he had had a difficult weekend. He had left here on Friday angry about the events that had occurred and felt like his manhood had been insulted. The patient admitted that despite his grandson's 1 yo birthday party on Saturday, he couldn't let go of the feelings and frustrations of Friday's group. On Sunday, he reported going with his wife to the church he had grown up in. it had been a cathartic experience for him and he noted that the entire service seemed to be "directed towards me". The patient admitted he had gone up to the front and been blessed by the pastor and it had proven to be a very powerful experience. Today, he reported, "I am in a different place today". The  patient seems to have let go of the resentment and anger of 3 days ago. His sobriety date remains 11/7.    Family Program: Family present? No   Name of family member(s):   UDS collected: Yes Results: not back yet  AA/NA attended?: YesFriday  Sponsor?: No   Zakariya Knickerbocker, LCAS

## 2013-09-09 ENCOUNTER — Other Ambulatory Visit (HOSPITAL_COMMUNITY): Payer: 59 | Admitting: Psychology

## 2013-09-09 DIAGNOSIS — F332 Major depressive disorder, recurrent severe without psychotic features: Secondary | ICD-10-CM

## 2013-09-09 DIAGNOSIS — F1020 Alcohol dependence, uncomplicated: Secondary | ICD-10-CM

## 2013-09-11 ENCOUNTER — Encounter (HOSPITAL_COMMUNITY): Payer: Self-pay | Admitting: Psychology
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
## 2013-09-12 ENCOUNTER — Encounter (HOSPITAL_COMMUNITY): Payer: Self-pay | Admitting: Psychology

## 2013-09-12 ENCOUNTER — Other Ambulatory Visit (HOSPITAL_COMMUNITY): Payer: 59 | Admitting: Psychology

## 2013-09-12 DIAGNOSIS — I2510 Atherosclerotic heart disease of native coronary artery without angina pectoris: Secondary | ICD-10-CM

## 2013-09-12 DIAGNOSIS — Z634 Disappearance and death of family member: Secondary | ICD-10-CM

## 2013-09-12 DIAGNOSIS — F332 Major depressive disorder, recurrent severe without psychotic features: Secondary | ICD-10-CM

## 2013-09-12 DIAGNOSIS — F1020 Alcohol dependence, uncomplicated: Secondary | ICD-10-CM

## 2013-09-12 NOTE — Progress Notes (Unsigned)
Patient ID: SEVEN DOLLENS, male   DOB: 1971/08/06, 42 y.o.   MRN: [id number] CD-IOP: Treatment Plan Update. I met with the patient this morning before group. He reported having attended an AA meeting on Saturday evening and picking up his 30 day chip. This was great news and he admitted he felt really good about it. I explained the importance of a review of his goals of treatment. He is making good progress in his goal of sobriety and is receptive to attending 4 - 12 step meetings per week. When asked about a sponsor, the patient reported he has a fellow in mind who attends the Saturday evening meetings and he is considering asking him. I encouraged him to secure a sponsor before he completes the program. Regarding his 3rd treatment goal, the patient reported he is feeling much happier and more at peace in his life. He is attending church, working out, getting plenty of rest and enjoying this time with his family. The patient is making remarkably good progress, especially considering the resistance he had at first. He appears more open and receptive to different ways of thinking and not quite so critical or quick to judge. The documentation was reviewed, signed, and the update completed accordingly. His sobriety date is 11/7. We will continue to follow closely in the days and weeks ahead.

## 2013-09-13 ENCOUNTER — Encounter (HOSPITAL_COMMUNITY): Payer: Self-pay | Admitting: Psychology
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
## 2013-09-13 NOTE — Progress Notes (Signed)
    Daily Group Progress Note  Program: CD-IOP   Group Time: 1-2:30 pm  Participation Level: Active  Behavioral Response: Appropriate and Sharing  Type of Therapy: Process Group  Topic: Group Process: the first part of group was spent in  process. Members shared about the past weekend and any successes or challenges they have had in early recovery. A new group member was present and she shared a little about herself and what she hopes to get from the group.   Group Time: 2:45-4pm  Participation Level: Active  Behavioral Response: Sharing  Type of Therapy: Psycho-education Group  Topic: Rational-Emotive Therapy/Cognitive Distortions: the second half of group was spent in a psycho-ed presentation. Handouts were provided to group members outlining how we come to experience emotional disturbances. The take home message was that all events are followed closely by one's interpretation of them, followed by the emotional consequences or responses of that interpretation. Examples were listed on the board and members were challenged to identify different ways one could interpret an event. Also included was a handout out on Cognitive Distortions with members identifying things they tend to do that are, actually, 'twisted or distorted forms of thinking. The session proved engaging with members sharing openly.   Summary: The patient reported a busy weekend. He noted he had picked up his 30 day chip at a Saturday evening AA meeting. Another member had been present and she agreed that it was a good meeting and he had received good support and validation for this achievement. The patient expressed concerns about his 36 yo son and the poor example he may have set on his faith. In the second half of group, the patient he frequently had a "mental filter". this meant he often saw only the negative aspects of an event and ignored the positive aspects of the same event. Another member challenged him about a  stereotype he had expressed regarding women and sending text messages. The patient explained that he was joking, but apologized to her. His sobriety date is 11/7.   Family Program: Family present? No   Name of family member(s):   UDS collected: Yes Results: not returned from lab yet  AA/NA attended?: [employer]?: No, but he is looking for one   Kalima Saylor, LCAS

## 2013-09-14 ENCOUNTER — Other Ambulatory Visit (HOSPITAL_COMMUNITY): Payer: 59

## 2013-09-16 ENCOUNTER — Other Ambulatory Visit (HOSPITAL_COMMUNITY): Payer: 59

## 2013-09-19 ENCOUNTER — Other Ambulatory Visit (HOSPITAL_COMMUNITY): Payer: 59 | Admitting: Psychology

## 2013-09-19 ENCOUNTER — Telehealth (HOSPITAL_COMMUNITY): Payer: Self-pay | Admitting: Psychology

## 2013-09-19 DIAGNOSIS — F1020 Alcohol dependence, uncomplicated: Secondary | ICD-10-CM

## 2013-09-20 ENCOUNTER — Encounter (HOSPITAL_COMMUNITY): Payer: Self-pay | Admitting: Psychology

## 2013-09-20 NOTE — Progress Notes (Signed)
    Daily Group Progress Note  Program: CD-IOP   Group Time: 1-2:30 pm  Participation Level: Active  Behavioral Response: Appropriate and Sharing  Type of Therapy: Process Group  Topic: Group Process: The first half of group was spent in process. Members shared about the past weekend and any struggles or issues that may have appeared in early recovery. Most of the members had attended 12-step meetings, but at least two members did not go to any meetings. Other members shared why they attend so many meetings and the goal of making attendance a daily ritual is one reason behind going to so many meetings.  The session went well with good disclosure and feedback among members.  Group Time: 2:45- 4pm  Participation Level: Active  Behavioral Response: Rationalizing and Resistant  Type of Therapy: Psycho-education Group  Topic: PAWS/Holiday Plans: the second half of group was spent in a psycho-education session. Group members were provided with handout on PAWS and members took turns reading the symptoms of PAWS - Post Acute Withdrawal Symptoms. Members shared how they had experienced some of these symptoms in their daily lives and the frustrations and fears that come with them. Another handout was provided mid-way through and it requested members complete the calendar from tomorrow through next Monday. They were asked to account for the time over Christmas and what they intend to do. Members shared openly about their plans and more than one member admitted it was good to write it down because she saw that she needed to make plans. The members will be questioned about their holiday plans in the next session to insure they have made efforts to support their recovery.   Summary: The patient reported he is feeling much better. He had missed the session on Friday because he was sick and he brought a note from his PCP. He admitted he had not yet secured a sponsor, but had a fellow in mind. In the  second half of group, the patient expressed frustration and noted his wife was not pleased with him having to come to group on Wednesday. He noted they have Christmas rituals and he will miss his niece and nephew if he comes to group from 69-12. I explained the importance of group and the fact that many might need to make changes in some of those rituals now that they are changing. I also used the analogy of a cancer patient. If this patient was scheduled for chemo or radiation treatment would he skip it because of this Christmas ritual? Everyone agreed that they wouldn't skip an important treatment and I encouraged them to regard their treatment here as just as critical as any other type of treatment. The patient reported his wife wasn't happy about it. He shared little else in the session, but his sobriety date remains 11/7. -   Family Program: Family present? No   Name of family member(s):   UDS collected: Yes Results:nnot back yet  AA/NA attended?: No , patient was sick this weekend  Sponsor?: No   Loni Delbridge, LCAS

## 2013-09-21 ENCOUNTER — Other Ambulatory Visit (HOSPITAL_COMMUNITY): Payer: 59

## 2013-09-23 ENCOUNTER — Other Ambulatory Visit (HOSPITAL_COMMUNITY): Payer: 59

## 2013-09-26 ENCOUNTER — Other Ambulatory Visit (HOSPITAL_COMMUNITY): Payer: 59 | Admitting: Psychology

## 2013-09-26 DIAGNOSIS — I2510 Atherosclerotic heart disease of native coronary artery without angina pectoris: Secondary | ICD-10-CM

## 2013-09-26 DIAGNOSIS — F332 Major depressive disorder, recurrent severe without psychotic features: Secondary | ICD-10-CM

## 2013-09-26 DIAGNOSIS — F1020 Alcohol dependence, uncomplicated: Secondary | ICD-10-CM

## 2013-09-28 ENCOUNTER — Other Ambulatory Visit (HOSPITAL_COMMUNITY): Payer: 59

## 2013-09-28 ENCOUNTER — Encounter (HOSPITAL_COMMUNITY): Payer: Self-pay | Admitting: Psychology

## 2013-09-28 NOTE — Progress Notes (Signed)
    Daily Group Progress Note  Program: CD-IOP   Group Time: 1-2:30 pm  Participation Level: Minimal  Behavioral Response: Sharing  Type of Therapy: Process Group  Topic: Group Process; the first part off group was spent in process. Members shared about the past holiday weekend and any issues or concerns they may have had in early recovery. One member admitted he had taken a percocet on Friday after spending the previous few days helping his daughter move into her new home. He pointed out that some people with chronic pain didn't consider taking a prescription medication properly a problem. He was very resistant and made many excuses. The group said little to challenge this member. Other than this member, everyone else reported their same sobriety date. Three members were absent from the group.   Group Time: 2:45- 4pm  Participation Level: Active  Behavioral Response: Sharing  Type of Therapy: Psycho-education Group  Topic: The Neurobiology of Addiction: the second half of group was spent in a psycho-ed session about brain chemistry and addiction. Members watched a brief film by a Well[employer] geologist of the [place]. In detailed, yet simple terms, he described what happens in the addict's brain as a result of drug use. The session proved very informative and members agreed that it made sense and helped them better understand their own struggles and journey into addiction. Today, drug tests were collected for all group members present.  Summary: The patient reported he had a good Christmas. He reported that he had not gotten to see his niece because she was with her mother, who is an active addict. That was disappointing for him. The patient reported he had attended church on Sunday and became very emotional. He admitted that it is difficult to believe that his brother is dead and he has continued to think that he would walk in at any moment. The patient explained his emotion came from the  realization that he isn't coming back. As he described these feelings, the patient became very teary. As the group session neared the end, the patient informed his fellow group members that this would be his last session. He explained he would be returning to work next Monday. He explained that he had a broken tooth and couldn't afford to get it fixed right now unless he got some income. Although it was just an excuse, the group responded in a supportive and encouraging manner. There was no reason to challenge him at this point. He is leaving and has clearly resisted much of the instructions given previously in group. The patient thanked the group for their support and noted that if he had not entered treatment, he wasn't sure where he would be. He reported that his insurance company already has lined him up for additional individual counseling to address other issues that remain. The patient's presence today and explanation allowed for proper closure within the group. He departs with a sobriety date of 11/7. The patient is leaving prematurely and his prognosis is poor.   Family Program: Family present? No   Name of family member(s):   UDS collected: No Results:   AA/NA attended?: No  Sponsor?: No   Yareth Kearse, LCAS

## 2013-09-30 ENCOUNTER — Other Ambulatory Visit (HOSPITAL_COMMUNITY): Payer: 59 | Attending: Psychiatry

## 2013-10-03 ENCOUNTER — Other Ambulatory Visit (HOSPITAL_COMMUNITY): Payer: 59

## 2013-10-05 ENCOUNTER — Other Ambulatory Visit (HOSPITAL_COMMUNITY): Payer: 59

## 2013-10-07 ENCOUNTER — Other Ambulatory Visit (HOSPITAL_COMMUNITY): Payer: 59

## 2013-10-10 ENCOUNTER — Other Ambulatory Visit (HOSPITAL_COMMUNITY): Payer: 59

## 2013-10-12 ENCOUNTER — Other Ambulatory Visit (HOSPITAL_COMMUNITY): Payer: 59

## 2013-10-14 ENCOUNTER — Other Ambulatory Visit (HOSPITAL_COMMUNITY): Payer: 59

## 2013-10-17 ENCOUNTER — Other Ambulatory Visit (HOSPITAL_COMMUNITY): Payer: 59

## 2013-11-08 ENCOUNTER — Encounter: Payer: Self-pay | Admitting: Psychiatry

## 2013-11-23 ENCOUNTER — Other Ambulatory Visit: Payer: Self-pay | Admitting: Endocrinology

## 2013-11-23 NOTE — Telephone Encounter (Signed)
Done

## 2013-11-23 NOTE — Telephone Encounter (Signed)
Please refill x 1 Ov is due  

## 2013-12-28 ENCOUNTER — Other Ambulatory Visit: Payer: Self-pay | Admitting: Adult Health

## 2014-01-10 ENCOUNTER — Telehealth: Payer: Self-pay | Admitting: *Deleted

## 2014-01-10 NOTE — Telephone Encounter (Signed)
Gave pt free clinic's number.

## 2014-01-10 NOTE — Telephone Encounter (Signed)
Pt states that he lost his insurance and needs to get help with getting his medications.

## 2014-01-12 ENCOUNTER — Other Ambulatory Visit: Payer: Self-pay | Admitting: Adult Health

## 2014-02-16 ENCOUNTER — Ambulatory Visit: Payer: Self-pay | Admitting: Cardiology

## 2014-02-24 ENCOUNTER — Encounter: Payer: Self-pay | Admitting: *Deleted

## 2014-03-04 ENCOUNTER — Other Ambulatory Visit: Payer: Self-pay | Admitting: Endocrinology

## 2014-03-06 ENCOUNTER — Other Ambulatory Visit: Payer: Self-pay | Admitting: *Deleted

## 2014-03-06 NOTE — Telephone Encounter (Signed)
duplicate

## 2014-03-06 NOTE — Telephone Encounter (Signed)
Please refill x 1 Ov is due  

## 2014-03-06 NOTE — Telephone Encounter (Signed)
Rx refilled.

## 2014-03-30 ENCOUNTER — Telehealth: Payer: Self-pay

## 2014-03-30 NOTE — Telephone Encounter (Signed)
Pt lost insurance,I have given him samples of effient 10 mg for 21 days.I have referred pt to Denyse Amass [facility].He has already seen her this am. He also has number to free clinic

## 2014-03-30 NOTE — Telephone Encounter (Signed)
Per Jory Sims NP we will d/c zocor and replace with crestor 10 mg as [facility] can get crestor free for him.She also d/c'd gemfibrizol

## 2014-04-18 ENCOUNTER — Encounter: Payer: Self-pay | Admitting: *Deleted

## 2014-06-21 ENCOUNTER — Encounter: Payer: Self-pay | Admitting: *Deleted

## 2014-08-02 ENCOUNTER — Encounter: Payer: Self-pay | Admitting: Endocrinology

## 2014-08-02 ENCOUNTER — Ambulatory Visit (INDEPENDENT_AMBULATORY_CARE_PROVIDER_SITE_OTHER): Payer: BC Managed Care – PPO | Admitting: Endocrinology

## 2014-08-02 VITALS — BP 113/65 | HR 61 | Temp 98.3°F | Ht 69.0 in | Wt 233.0 lb

## 2014-08-02 DIAGNOSIS — E291 Testicular hypofunction: Secondary | ICD-10-CM

## 2014-08-02 NOTE — Progress Notes (Signed)
Subjective:    Patient ID: Sean Rivas, male    DOB: 1971/01/25, 43 y.o.   MRN: [id number]  HPI Pt returns for f/u of idoipathic central hypogonadism (dx'ed 2014; he was on several topical supplements, but testosterone level has stayed low on these; also, sxs did not improve; he has 2 biological children--no h/o infertility; he has never been on androgens other than what was prescribed for him; he was rx'ed clomid in 2014).  Since on the clomid, libido is better.  ED sxs are also improved.  He has regained his insurance.  Past Medical History  Diagnosis Date  . Coronary artery disease     A.  07/18/2004 - PCI/DES OM2: 2.5x13 Cypher DES Dist/3.0x13 Cypher Prox;   B.  06/2009 & 06/2011- NL Stress Echo EF 70-75%;  C. 09/21/2011 s/p STEMI 2/2 late stent thrombosis -  thrombectomy and PCI/DES 3.0 x 28 mm Promus  . Hyperlipidemia   . Hypertension   . GERD (gastroesophageal reflux disease)   . Tobacco abuse     24 pack year - 1ppd as of 09/21/2011    Past Surgical History  Procedure Laterality Date  . Coronary stent placement      History   Social History  . Marital Status: Married    Spouse Name: N/A    Number of Children: N/A  . Years of Education: N/A   Occupational History  . Works @ [employer] History Main Topics  . Smoking status: Current Every Day Smoker -- 1.00 packs/day for 24 years    Types: Cigarettes  . Smokeless tobacco: Never Used  . Alcohol Use: 24.0 oz/week    40 Cans of beer per week     Comment: 8-14 beers daily  . Drug Use: No  . Sexual Activity: Yes   Other Topics Concern  . Not on file   Social History Narrative   Lives in [place] with wife.  Exercises most days of the week - runs/lift weights.    Current Outpatient Prescriptions on File Prior to Visit  Medication Sig Dispense Refill  . allopurinol (ZYLOPRIM) 100 MG tablet Take 1 tablet (100 mg total) by mouth daily. 30 tablet 0  . aspirin EC 81 MG tablet Take 1  tablet (81 mg total) by mouth daily. 30 tablet 0  . clomiPHENE (CLOMID) 50 MG tablet TAKE 1/4 TABLET BY MOUTH EVERY DAY 15 tablet 1  . EFFIENT 10 MG TABS tablet TAKE 1 TABLET (10 MG TOTAL) BY MOUTH DAILY. 30 tablet 10  . hydrOXYzine (ATARAX/VISTARIL) 25 MG tablet Take 1 tablet (25 mg total) by mouth every 6 (six) hours as needed for anxiety (or CIWA score </= 10). 30 tablet 0  . lisinopril (PRINIVIL,ZESTRIL) 20 MG tablet Take 1 tablet (20 mg total) by mouth daily.    . metoprolol succinate (TOPROL XL) 25 MG 24 hr tablet Take 1 tablet (25 mg total) by mouth daily. 30 tablet 6  . NITROSTAT 0.4 MG SL tablet PLACE 1 TABLET (0.4 MG TOTAL) UNDER THE TONGUE EVERY 5 (FIVE) MINUTES AS NEEDED. FOR CHEST PAIN 2 tablet 4  . pantoprazole (PROTONIX) 40 MG tablet Take 1 tablet (40 mg total) by mouth daily. 30 tablet 0  . prasugrel (EFFIENT) 10 MG TABS tablet Take 1 tablet (10 mg total) by mouth daily. 30 tablet   . rosuvastatin (CRESTOR) 10 MG tablet Take 10 mg by mouth daily.     No current facility-administered medications on file prior to  visit.    No Known Allergies  Family History  Problem Relation Age of Onset  . Coronary artery disease Brother 35    s/p stenting  . Alcohol abuse Brother   . Heart failure Brother 38    s/p ICD  . Coronary artery disease Mother 49    deceased @ 40  . Alcohol abuse Father   . Alcohol abuse Paternal Aunt   . Alcohol abuse Paternal Uncle   . Alcohol abuse Paternal Grandfather     BP 113/65 mmHg  Pulse 61  Temp(Src) 98.3 F (36.8 C) (Oral)  Ht 5\' 9"  (1.753 m)  Wt 233 lb (105.688 kg)  BMI 34.39 kg/m2  SpO2 96%  Review of Systems Muscle strength is good.      Objective:   Physical Exam VITAL SIGNS:  See vs page GENERAL: no distress GENITALIA: Normal male testicles, scrotum, and penis. Ext: no edema.         Assessment & Plan:  Hypogonadism, on rx.  Patient is advised the following: Patient Instructions  blood tests are being requested for  you today.  We'll contact you with results. normalization of testosterone is not known to harm you.  however, there are "theoretical" risks, including increased fertility, hair loss, prostate cancer, benign prostate enlargement, blood clots, liver problems, lower hdl ("good cholesterol"), sleep apnea, and behavior changes. Please return in 1 year.

## 2014-08-02 NOTE — Patient Instructions (Addendum)
blood tests are being requested for you today.  We'll contact you with results. normalization of testosterone is not known to harm you.  however, there are "theoretical" risks, including increased fertility, hair loss, prostate cancer, benign prostate enlargement, blood clots, liver problems, lower hdl ("good cholesterol"), sleep apnea, and behavior changes. Please return in 1 year.

## 2014-08-03 ENCOUNTER — Other Ambulatory Visit: Payer: Self-pay | Admitting: Endocrinology

## 2014-08-03 DIAGNOSIS — E291 Testicular hypofunction: Secondary | ICD-10-CM

## 2014-08-03 LAB — TESTOSTERONE: Testosterone: 271.33 ng/dL — ABNORMAL LOW (ref 300.00–890.00)

## 2014-08-03 MED ORDER — CLOMIPHENE CITRATE 50 MG PO TABS: ORAL_TABLET | ORAL | Status: DC

## 2014-09-06 ENCOUNTER — Encounter (HOSPITAL_COMMUNITY): Payer: Self-pay | Admitting: Cardiovascular Disease

## 2014-09-26 ENCOUNTER — Encounter: Payer: Self-pay | Admitting: *Deleted

## 2014-10-12 ENCOUNTER — Other Ambulatory Visit: Payer: Self-pay | Admitting: Adult Health

## 2015-02-12 ENCOUNTER — Encounter: Payer: Self-pay | Admitting: Adult Health

## 2015-02-12 ENCOUNTER — Encounter: Payer: Self-pay | Admitting: *Deleted

## 2015-02-12 ENCOUNTER — Ambulatory Visit (INDEPENDENT_AMBULATORY_CARE_PROVIDER_SITE_OTHER): Payer: BC Managed Care – PPO | Admitting: Adult Health

## 2015-02-12 VITALS — BP 128/84 | HR 61 | Ht 69.0 in | Wt 230.0 lb

## 2015-02-12 DIAGNOSIS — I10 Essential (primary) hypertension: Secondary | ICD-10-CM | POA: Diagnosis not present

## 2015-02-12 DIAGNOSIS — Z720 Tobacco use: Secondary | ICD-10-CM

## 2015-02-12 DIAGNOSIS — Z136 Encounter for screening for cardiovascular disorders: Secondary | ICD-10-CM | POA: Diagnosis not present

## 2015-02-12 NOTE — Progress Notes (Deleted)
Name: Sean Rivas    DOB: September 14, 1971  Age: 44 y.o.  MR#: [id number]       PCP:  Rory Percy, MD      Insurance: Payor: [employer] [place] / Plan: [facility] PPO / Product Type: *No Product type* /   CC:    Chief Complaint  Patient presents with  . Coronary Artery Disease  . Hypertension    VS Filed Vitals:   02/12/15 1357  BP: 128/84  Pulse: 61  Height: 5\' 9"  (1.753 m)  Weight: 230 lb (104.327 kg)  SpO2: 97%    Weights Current Weight  02/12/15 230 lb (104.327 kg)  08/02/14 233 lb (105.688 kg)  08/05/13 220 lb (99.791 kg)    Blood Pressure  BP Readings from Last 3 Encounters:  02/12/15 128/84  08/02/14 113/65  08/08/13 120/81     Admit date:  (Not on file) Last encounter with RMR:  10/12/2014   Allergy Review of patient's allergies indicates no known allergies.  Current Outpatient Prescriptions  Medication Sig Dispense Refill  . allopurinol (ZYLOPRIM) 100 MG tablet Take 1 tablet (100 mg total) by mouth daily. 30 tablet 0  . aspirin EC 81 MG tablet Take 1 tablet (81 mg total) by mouth daily. 30 tablet 0  . clomiPHENE (CLOMID) 50 MG tablet 1/2 tab daily 15 tablet 11  . lisinopril (PRINIVIL,ZESTRIL) 20 MG tablet TAKE ONE TABLET BY MOUTH EVERY DAY 30 tablet 6  . metoprolol succinate (TOPROL XL) 25 MG 24 hr tablet Take 1 tablet (25 mg total) by mouth daily. 30 tablet 6  . NITROSTAT 0.4 MG SL tablet PLACE 1 TABLET (0.4 MG TOTAL) UNDER THE TONGUE EVERY 5 (FIVE) MINUTES AS NEEDED. FOR CHEST PAIN 2 tablet 4  . pantoprazole (PROTONIX) 40 MG tablet Take 1 tablet (40 mg total) by mouth daily. 30 tablet 0  . prasugrel (EFFIENT) 10 MG TABS tablet Take 1 tablet (10 mg total) by mouth daily. 30 tablet   . rosuvastatin (CRESTOR) 10 MG tablet Take 10 mg by mouth daily.     No current facility-administered medications for this visit.    Discontinued Meds:    Medications Discontinued During This Encounter  Medication Reason  . hydrOXYzine (ATARAX/VISTARIL) 25 MG  tablet Error  . lisinopril (PRINIVIL,ZESTRIL) 20 MG tablet Error  . EFFIENT 10 MG TABS tablet Error    Patient Active Problem List   Diagnosis Date Noted  . Alcohol dependence 08/06/2013  . Substance induced mood disorder 08/06/2013  . Major depressive disorder, recurrent episode, severe, without mention of psychotic behavior 08/06/2013  . Bereavement 08/06/2013  . Posttraumatic stress disorder 08/06/2013  . Alcoholism 06/22/2012  . Hypogonadism male 06/22/2012  . ST elevation myocardial infarction (STEMI) of inferior wall 09/21/2011  . GERD (gastroesophageal reflux disease)   . Hypertension   . Hyperlipidemia   . Coronary artery disease   . Tobacco abuse   . ABDOMINAL PAIN-GENERALIZED 08/16/2009  . ABDOMINAL PAIN-EPIGASTRIC 08/08/2009    LABS    Component Value Date/Time   NA 137 08/05/2013 1025   NA 141 08/02/2013 2349   NA 143 01/21/2012 0856   K 4.1 08/05/2013 1025   K 4.3 08/02/2013 2349   K 4.6 01/21/2012 0856   CL 103 08/05/2013 1025   CL 107 08/02/2013 2349   CL 108 01/21/2012 0856   CO2 22 08/05/2013 1025   CO2 23 08/02/2013 2349   CO2 25 01/21/2012 0856   GLUCOSE 119* 08/05/2013 1025  GLUCOSE 107* 08/02/2013 2349   GLUCOSE 91 01/21/2012 0856   BUN 17 08/05/2013 1025   BUN 15 08/02/2013 2349   BUN 20 01/21/2012 0856   CREATININE 1.01 08/05/2013 1025   CREATININE 1.06 08/02/2013 2349   CREATININE 1.10 01/21/2012 0856   CREATININE 0.89 09/23/2011 0548   CALCIUM 9.9 08/05/2013 1025   CALCIUM 9.3 08/02/2013 2349   CALCIUM 9.8 01/21/2012 0856   GFRNONAA >90 08/05/2013 1025   GFRNONAA 86* 08/02/2013 2349   GFRNONAA >90 09/23/2011 0548   GFRAA >90 08/05/2013 1025   GFRAA >90 08/02/2013 2349   GFRAA >90 09/23/2011 0548   CMP     Component Value Date/Time   NA 137 08/05/2013 1025   K 4.1 08/05/2013 1025   CL 103 08/05/2013 1025   CO2 22 08/05/2013 1025   GLUCOSE 119* 08/05/2013 1025   BUN 17 08/05/2013 1025   CREATININE 1.01 08/05/2013 1025    CREATININE 1.10 01/21/2012 0856   CALCIUM 9.9 08/05/2013 1025   PROT 7.4 01/21/2012 0856   ALBUMIN 5.1 01/21/2012 0856   AST 25 01/21/2012 0856   ALT 26 01/21/2012 0856   ALKPHOS 54 01/21/2012 0856   BILITOT 0.5 01/21/2012 0856   GFRNONAA >90 08/05/2013 1025   GFRAA >90 08/05/2013 1025       Component Value Date/Time   WBC 6.9 08/05/2013 1025   WBC 6.9 08/02/2013 2349   WBC 11.1* 09/22/2011 0002   HGB 14.7 08/05/2013 1025   HGB 14.9 08/02/2013 2349   HGB 13.7 09/22/2011 0002   HCT 43.0 08/05/2013 1025   HCT 42.0 08/02/2013 2349   HCT 40.7 09/22/2011 0002   MCV 85.8 08/05/2013 1025   MCV 86.8 08/02/2013 2349   MCV 87.9 09/22/2011 0002    Lipid Panel     Component Value Date/Time   CHOL 240* 01/21/2012 0856   TRIG 210* 01/21/2012 0856   HDL 48 01/21/2012 0856   CHOLHDL 5.0 01/21/2012 0856   VLDL 42* 01/21/2012 0856   LDLCALC 150* 01/21/2012 0856    ABG    Component Value Date/Time   PHART 7.399 09/21/2011 1355   PCO2ART 36.4 09/21/2011 1355   PO2ART 111.0* 09/21/2011 1355   HCO3 22.4 09/21/2011 1355   TCO2 24 09/21/2011 1355   ACIDBASEDEF 2.0 09/21/2011 1355   O2SAT 98.0 09/21/2011 1355     Lab Results  Component Value Date   TSH 2.18 07/19/2012   BNP (last 3 results) No results for input(s): BNP in the last 8760 hours.  ProBNP (last 3 results) No results for input(s): PROBNP in the last 8760 hours.  Cardiac Panel (last 3 results) No results for input(s): CKTOTAL, CKMB, TROPONINI, RELINDX in the last 72 hours.  Iron/TIBC/Ferritin/ %Sat No results found for: IRON, TIBC, FERRITIN, IRONPCTSAT   EKG Orders placed or performed in visit on 02/12/15  . EKG 12-Lead     Prior Assessment and Plan Problem List as of 02/12/2015      Cardiovascular and Mediastinum   Hypertension   Last Assessment & Plan 12/31/2012 Office Visit Written 12/31/2012 11:37 AM by Lendon Colonel, NP    Excellent control of blood pressure on current medication regimen. No changes  at this time. Labs for primary care.      Coronary artery disease   Last Assessment & Plan 12/31/2012 Office Visit Written 12/31/2012 11:36 AM by Lendon Colonel, NP    Stable pain free and doing well from a cardiovascular standpoint. He is only had to take  nitroglycerin a couple times over the last year. He states is probably more related to his paranoia about recurrence of MI with minimal chest pain at that time. He continues medically compliant. We will continue him on Effient , aspirin, beta blocker, and ACE inhibitor. Followup labs are due and he will be seen by his primary care physician within the next month for annual labs. Continue risk factor management. See him again in one year unless he is symptomatic.      ST elevation myocardial infarction (STEMI) of inferior wall     Digestive   GERD (gastroesophageal reflux disease)     Endocrine   Hypogonadism male     Other   ABDOMINAL PAIN-EPIGASTRIC   ABDOMINAL PAIN-GENERALIZED   Last Assessment & Plan 07/04/2011 Office Visit Written 07/04/2011  1:15 PM by Lendon Colonel, NP    Gallbladder ultrasound demonstrated normal gallbladder without stones, or dilation of the bile ducts. This was discussed with the patient and copies are provided for him to take to PCP.  He is advised to continue PPI (Dr. Reginia Naas) as directed as this has been helpful in alleviating symptoms.  HIDA scan can be completed at the discretion of PCP if he feels this is necessary.      Hyperlipidemia   Last Assessment & Plan 12/31/2012 Office Visit Written 12/31/2012 11:37 AM by Lendon Colonel, NP    Continue statin therapy and ongoing risk management with low cholesterol diet.      Tobacco abuse   Last Assessment & Plan 10/06/2011 Office Visit Written 10/06/2011  3:50 PM by Lendon Colonel, NP    Cessation is discussed. See above.      Alcoholism   Alcohol dependence   Substance induced mood disorder   Major depressive disorder, recurrent episode,  severe, without mention of psychotic behavior   Bereavement   Posttraumatic stress disorder       Imaging: No results found.

## 2015-02-12 NOTE — Progress Notes (Signed)
Cardiology Office Note   Date:  02/12/2015   ID:  Sean Rivas, DOB [birth year], MRN [id number]  PCP:  Rory Percy, MD  Cardiologist:  To Be Est. Jory Sims, NP   Chief Complaint  Patient presents with  . Coronary Artery Disease  . Hypertension      History of Present Illness: Sean Rivas is a 44 y.o. male who presents for followup appointment after not being seen since April of 2014 with known history of CAD, non-ST elevated MI, with a stent to second OM, with 2 prior stent placement to the OM.the patient last office visit was pain-free and remained on dual antiplatelet therapy.  At that time.  he was advised on smoking cessation.   He has had a two lapse in his cardiology evaluation due to family issues to include the death of mother and brother within a month of each other. His brother was followed by our practice. He lost his job and required psychiatric counseling do to life circumstances for situational depression. 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 He will have a lapse in his insurance and is requesting Rx refills for 3 months until new insurance takes affect.     He denies recurrent chest pain, dyspnea, dizziness, or cardiac symptoms. Due to his new job and cardiac history, he is required to have ETT and echo for LV fx, whichis to be faxed to DOT.    Past Medical History  Diagnosis Date  . Coronary artery disease     A.  07/18/2004 - PCI/DES OM2: 2.5x13 Cypher DES Dist/3.0x13 Cypher Prox;   B.  06/2009 & 06/2011- NL Stress Echo EF 70-75%;  C. 09/21/2011 s/p STEMI 2/2 late stent thrombosis -  thrombectomy and PCI/DES 3.0 x 28 mm Promus  . Hyperlipidemia   . Hypertension   . GERD (gastroesophageal reflux disease)   . Tobacco abuse     24 pack year - 1ppd as of 09/21/2011    Past Surgical History  Procedure Laterality Date  . Coronary stent placement    . Left heart catheterization with  coronary angiogram N/A 09/21/2011    Procedure: LEFT HEART CATHETERIZATION WITH CORONARY ANGIOGRAM;  Surgeon: Wellington Hampshire, MD;  Location: [facility] CATH LAB;  Service: Cardiovascular;  Laterality: N/A;  . Percutaneous coronary stent intervention (pci-s)  09/21/2011    Procedure: PERCUTANEOUS CORONARY STENT INTERVENTION (PCI-S);  Surgeon: Wellington Hampshire, MD;  Location: [facility] CATH LAB;  Service: Cardiovascular;;     Current Outpatient Prescriptions  Medication Sig Dispense Refill  . allopurinol (ZYLOPRIM) 100 MG tablet Take 1 tablet (100 mg total) by mouth daily. 30 tablet 0  . aspirin EC 81 MG tablet Take 1 tablet (81 mg total) by mouth daily. 30 tablet 0  . clomiPHENE (CLOMID) 50 MG tablet 1/2 tab daily 15 tablet 11  . EFFIENT 10 MG TABS tablet TAKE 1 TABLET (10 MG TOTAL) BY MOUTH DAILY. 30 tablet 10  . hydrOXYzine (ATARAX/VISTARIL) 25 MG tablet Take 1 tablet (25 mg total) by mouth every 6 (six) hours as needed for anxiety (or CIWA score </= 10). 30 tablet 0  . lisinopril (PRINIVIL,ZESTRIL) 20 MG tablet Take 1 tablet (20 mg total) by mouth daily.    [name] lisinopril (PRINIVIL,ZESTRIL) 20 MG tablet TAKE ONE TABLET BY MOUTH EVERY DAY 30 tablet 6  . metoprolol succinate (TOPROL XL) 25 MG 24 hr tablet Take 1 tablet (  25 mg total) by mouth daily. 30 tablet 6  . NITROSTAT 0.4 MG SL tablet PLACE 1 TABLET (0.4 MG TOTAL) UNDER THE TONGUE EVERY 5 (FIVE) MINUTES AS NEEDED. FOR CHEST PAIN 2 tablet 4  . pantoprazole (PROTONIX) 40 MG tablet Take 1 tablet (40 mg total) by mouth daily. 30 tablet 0  . prasugrel (EFFIENT) 10 MG TABS tablet Take 1 tablet (10 mg total) by mouth daily. 30 tablet   . rosuvastatin (CRESTOR) 10 MG tablet Take 10 mg by mouth daily.     No current facility-administered medications for this visit.    Allergies:   Review of patient's allergies indicates no known allergies.    Social History:  The patient  reports that he has been smoking Cigarettes.  He has a 24 pack-year smoking history.  He has never used smokeless tobacco. He reports that he drinks about 24.0 oz of alcohol per week. He reports that he does not use illicit drugs.   Family History:  The patient's family history includes Alcohol abuse in his brother, father, paternal aunt, paternal grandfather, and paternal uncle; Coronary artery disease (age of onset: 38) in his brother; Coronary artery disease (age of onset: 64) in his mother; Heart failure (age of onset: 33) in his brother.    ROS: .   All other systems are reviewed and negative.Unless otherwise mentioned in H&P above.   PHYSICAL EXAM: VS:  There were no vitals taken for this visit. , BMI There is no weight on file to calculate BMI. GEN: Well nourished, well developed, in no acute distress HEENT: normal Neck: no JVD, carotid bruits, or masses Cardiac: RRR; no murmurs, rubs, or gallops,no edema  Respiratory:  Clear to auscultation bilaterally, normal work of breathing GI: soft, nontender, nondistended, + BS MS: no deformity or atrophy Skin: warm and dry, no rash Neuro:  Strength and sensation are intact Psych: euthymic mood, full affect   EKG:  EKG ordered today. The ekg ordered today demonstrates  NSR with bradycardia, rate of 47 bpm.   Recent Labs: No results found for requested labs within last 365 days.    Lipid Panel    Component Value Date/Time   CHOL 240* 01/21/2012 0856   TRIG 210* 01/21/2012 0856   HDL 48 01/21/2012 0856   CHOLHDL 5.0 01/21/2012 0856   VLDL 42* 01/21/2012 0856   LDLCALC 150* 01/21/2012 0856      Wt Readings from Last 3 Encounters:  08/02/14 233 lb (105.688 kg)  08/05/13 220 lb (99.791 kg)  08/02/13 225 lb 9.6 oz (102.331 kg)      Other studies Reviewed: Additional studies/ records that were reviewed today include: None Review of the above records demonstrates: None   ASSESSMENT AND PLAN:  1. CAD: Known history of 3 stents to OM 2. He continues on EFFIENT 10 mg daily. We have given him samples to assist  him during insurance lapse time, with a 90 day Rx as well. He will continue metoprolol, lisinopril, and ASA. I will schedule a ETT and echo for this week. He will have information faxed to DOT, copy of letter is made for information.  He will have follow up appt with Dr. Bronson Ing or Dr. Harl Bowie to be established with new cardiologist as he was seen by Dr.Rothbart in the past.   2. Hypertension: BP is well controlled. He is medically compliant. He states that he can afford the antihypertensive medications as they are not as expensive as the EFFIENT.   3. Gout  Flare Up: We will schedule ETT and Echo for the end of the week to give him time to get this under control. He is on medications per PCP for management. Letter provided for his job to allow him to be off for ETT and Echo with return to work on Monday, Feb 19, 2015.    Current medicines are reviewed at length with the patient today.    Labs/ tests ordered today include: EKG, Echo and ETT.   Orders Placed This Encounter  Procedures  . EKG 12-Lead     Disposition:   FU with cardiologist to be established with new provider.  Signed, Jory Sims, NP  02/12/2015 1:55 PM    [facility] [address] [place], [place] [address] Phone: [phone]; Fax: [phone]

## 2015-02-12 NOTE — Patient Instructions (Addendum)
Your physician recommends that you schedule a follow-up appointment after Echo and Stress Test   Your physician recommends that you continue on your current medications as directed. Please refer to the Current Medication list given to you today.  Your physician has requested that you have an exercise tolerance test. For further information please visit [email or web address]. Please also follow instruction sheet, as given.  Your physician has requested that you have an echocardiogram. Echocardiography is a painless test that uses sound waves to create images of your heart. It provides your doctor with information about the size and shape of your heart and how well your heart's chambers and valves are working. This procedure takes approximately one hour. There are no restrictions for this procedure.  Thank you for choosing [facility]!

## 2015-02-15 ENCOUNTER — Ambulatory Visit (HOSPITAL_COMMUNITY)
Admission: RE | Admit: 2015-02-15 | Discharge: 2015-02-15 | Disposition: A | Discharge status: Home / Self Care | Payer: BC Managed Care – PPO | Source: Ambulatory Visit | Attending: Adult Health | Admitting: Adult Health

## 2015-02-15 ENCOUNTER — Ambulatory Visit (HOSPITAL_BASED_OUTPATIENT_CLINIC_OR_DEPARTMENT_OTHER)
Admission: RE | Admit: 2015-02-15 | Discharge: 2015-02-15 | Disposition: A | Discharge status: Home / Self Care | Payer: BC Managed Care – PPO | Source: Ambulatory Visit | Attending: Adult Health | Admitting: Adult Health

## 2015-02-15 DIAGNOSIS — Z136 Encounter for screening for cardiovascular disorders: Secondary | ICD-10-CM

## 2015-02-15 LAB — EXERCISE TOLERANCE TEST
CHL CUP RESTING HR STRESS: 64 {beats}/min
CSEPEW: 13.4 METS
Exercise duration (min): 10 min
Exercise duration (sec): 43 s
MPHR: 178 {beats}/min
Peak HR: 160 {beats}/min
Percent HR: 89 %

## 2015-02-15 NOTE — Addendum Note (Signed)
Addended by: Levonne Hubert on: 02/15/2015 10:07 AM   Modules accepted: Orders

## 2015-02-16 ENCOUNTER — Telehealth: Payer: Self-pay

## 2015-02-16 NOTE — Telephone Encounter (Signed)
Results given to patient,mailed results to pt

## 2015-02-16 NOTE — Telephone Encounter (Signed)
-----   Message from Lendon Colonel, NP sent at 02/15/2015  9:58 PM EDT ----- Normal stress test with excellent exercise tolerance. Send to patient along with Echo report so that he can bring results for DOT release.

## 2015-02-16 NOTE — Telephone Encounter (Signed)
ERROR

## 2015-02-18 ENCOUNTER — Other Ambulatory Visit: Payer: Self-pay | Admitting: Adult Health

## 2015-02-19 ENCOUNTER — Encounter: Payer: Self-pay | Admitting: Cardiovascular Disease

## 2015-02-19 ENCOUNTER — Ambulatory Visit (INDEPENDENT_AMBULATORY_CARE_PROVIDER_SITE_OTHER): Payer: BC Managed Care – PPO | Admitting: Cardiovascular Disease

## 2015-02-19 VITALS — BP 116/86 | HR 54 | Ht 69.0 in | Wt 230.0 lb

## 2015-02-19 DIAGNOSIS — IMO0001 Reserved for inherently not codable concepts without codable children: Secondary | ICD-10-CM

## 2015-02-19 DIAGNOSIS — I10 Essential (primary) hypertension: Secondary | ICD-10-CM

## 2015-02-19 DIAGNOSIS — E785 Hyperlipidemia, unspecified: Secondary | ICD-10-CM | POA: Diagnosis not present

## 2015-02-19 DIAGNOSIS — Z136 Encounter for screening for cardiovascular disorders: Secondary | ICD-10-CM | POA: Diagnosis not present

## 2015-02-19 DIAGNOSIS — I2510 Atherosclerotic heart disease of native coronary artery without angina pectoris: Secondary | ICD-10-CM

## 2015-02-19 NOTE — Patient Instructions (Signed)
Your physician wants you to follow-up in: 1 year with Dr Koneswaran You will receive a reminder letter in the mail two months in advance. If you don't receive a letter, please call our office to schedule the follow-up appointment.    Your physician recommends that you continue on your current medications as directed. Please refer to the Current Medication list given to you today.     Thank you for choosing [facility] !        

## 2015-02-19 NOTE — Progress Notes (Signed)
Patient ID: Sean Rivas, male   DOB: 16-Apr-1971, 44 y.o.   MRN: [id number]      SUBJECTIVE: The patient is a 44 year old male with a past medical history significant for coronary artery disease. This is my first time meeting him.  He initially went stenting of the second obtuse marginal branch in October 2005. He then sustained an inferior wall STEMI in December 2012 due to very late stent thrombosis of the distal Cypher stent in the OM 2 with significant disease in the gap between the previously placed stents. He had mild coronary artery disease elsewhere. At that time, he underwent successful thrombectomy and drug-eluting stent placement in the OM 2. The stent partially covered the distal stent as well as the non-stented area between the 2 stents and also overlapped the proximal stent.  He recently saw K. 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  Exercise treadmill stress test was normal and deemed low risk.  Echocardiogram demonstrated normal left ventricular systolic function and regional wall motion, LVEF 55-60% , with mild left atrial dilatation.  He works for the [employer]. He denies chest pain, palpitations, leg swelling, and shortness of breath.   Review of Systems: As per "subjective", otherwise negative.  No Known Allergies  Current Outpatient Prescriptions  Medication Sig Dispense Refill  . allopurinol (ZYLOPRIM) 100 MG tablet Take 1 tablet (100 mg total) by mouth daily. 30 tablet 0  . aspirin EC 81 MG tablet Take 1 tablet (81 mg total) by mouth daily. 30 tablet 0  . clomiPHENE (CLOMID) 50 MG tablet 1/2 tab daily 15 tablet 11  . lisinopril (PRINIVIL,ZESTRIL) 20 MG tablet TAKE ONE TABLET BY MOUTH EVERY DAY 30 tablet 6  . metoprolol succinate (TOPROL XL) 25 MG 24 hr tablet Take 1 tablet (25 mg total) by mouth daily. 30 tablet 6  . NITROSTAT 0.4 MG SL tablet PLACE 1 TABLET (0.4 MG TOTAL) UNDER THE TONGUE EVERY 5 (FIVE) MINUTES  AS NEEDED. FOR CHEST PAIN 25 tablet 3  . pantoprazole (PROTONIX) 40 MG tablet Take 1 tablet (40 mg total) by mouth daily. 30 tablet 0  . prasugrel (EFFIENT) 10 MG TABS tablet Take 1 tablet (10 mg total) by mouth daily. 30 tablet   . gemfibrozil (LOPID) 600 MG tablet Take 600 mg by mouth 2 (two) times daily.  3  . simvastatin (ZOCOR) 80 MG tablet Take 80 mg by mouth at bedtime.  3   No current facility-administered medications for this visit.    Past Medical History  Diagnosis Date  . Coronary artery disease     A.  07/18/2004 - PCI/DES OM2: 2.5x13 Cypher DES Dist/3.0x13 Cypher Prox;   B.  06/2009 & 06/2011- NL Stress Echo EF 70-75%;  C. 09/21/2011 s/p STEMI 2/2 late stent thrombosis -  thrombectomy and PCI/DES 3.0 x 28 mm Promus  . Hyperlipidemia   . Hypertension   . GERD (gastroesophageal reflux disease)   . Tobacco abuse     24 pack year - 1ppd as of 09/21/2011    Past Surgical History  Procedure Laterality Date  . Coronary stent placement    . Left heart catheterization with coronary angiogram N/A 09/21/2011    Procedure: LEFT HEART CATHETERIZATION WITH CORONARY ANGIOGRAM;  Surgeon: Wellington Hampshire, MD;  Location: [facility];  Service: Cardiovascular;  Laterality: N/A;  . Percutaneous coronary stent intervention (pci-s)  09/21/2011    Procedure: PERCUTANEOUS CORONARY STENT INTERVENTION (PCI-S);  Surgeon: Mertie Clause  Fletcher Anon, MD;  Location: [facility];  Service: Cardiovascular;;    History   Social History  . Marital Status: Married    Spouse Name: N/A  . Number of Children: N/A  . Years of Education: N/A   Occupational History  . Works @ [employer] History Main Topics  . Smoking status: Former Smoker -- 1.00 packs/day for 24 years    Types: Cigarettes    Quit date: 09/30/2011  . Smokeless tobacco: Never Used  . Alcohol Use: 24.0 oz/week    40 Cans of beer per week     Comment: 8-14 beers daily  . Drug Use: No  . Sexual Activity: Yes    Other Topics Concern  . Not on file   Social History Narrative   Lives in [place] with wife.  Exercises most days of the week - runs/lift weights.     Filed Vitals:   02/19/15 1416  BP: 116/86  Pulse: 54  Height: 5\' 9"  (1.753 m)  Weight: 230 lb (104.327 kg)  SpO2: 96%    PHYSICAL EXAM General: NAD HEENT: Normal. Neck: No JVD, no thyromegaly. Lungs: Clear to auscultation bilaterally with normal respiratory effort. CV: Nondisplaced PMI.  Regular rate and rhythm, normal S1/S2, no S3/S4, no murmur. No pretibial or periankle edema.  No carotid bruit.  Normal pedal pulses.  Abdomen: Soft, nontender, no hepatosplenomegaly, no distention.  Neurologic: Alert and oriented x 3.  Psych: Normal affect. Skin: Normal. Musculoskeletal: Normal range of motion, no gross deformities. Extremities: No clubbing or cyanosis.   ECG: Most recent ECG reviewed.      ASSESSMENT AND PLAN: 1. CAD with h/o very late stent thrombosis, with three stents in the OM2: Stable ischemic heart disease with normal exercise treadmill stress test and normal LV function. Continue aspirin 81 mg, Toprol-XL 25 mg, simvastatin 80 mg, lisinopril 20 mg, and Effient 10 mg.  2. Essential HTN: Well controlled. No changes.  3. Hyperlipidemia: Obtain copy of lipids from PCP. Continue statin therapy.  Dispo: f/u 1 year.   Time spent: 40 minutes, of which greater than 50% was spent reviewing symptoms, relevant blood tests and studies, and discussing management plan with the patient.  Kate Sable, M.D., F.A.C.C.

## 2015-02-22 ENCOUNTER — Telehealth: Payer: Self-pay | Admitting: *Deleted

## 2015-02-22 NOTE — Telephone Encounter (Signed)
ECHO and Stress test result faxed to [facility]  At patients request.

## 2015-04-22 ENCOUNTER — Other Ambulatory Visit: Payer: Self-pay | Admitting: Adult Health

## 2015-04-24 MED ORDER — METOPROLOL SUCCINATE ER 25 MG PO TB24: 25.0000 mg | ORAL_TABLET | Freq: Every day | ORAL | Status: DC

## 2015-04-24 NOTE — Telephone Encounter (Signed)
toprol refilled. 

## 2015-04-24 NOTE — Telephone Encounter (Signed)
-----   Message from Lendon Colonel, NP sent at 04/24/2015  8:06 AM EDT ----- Regarding: RE: clarify med order According to Dr. Marthann Schiller note on 02/19/2015  "Continue aspirin 81 mg, Toprol-XL 25 mg, simvastatin 80 mg, lisinopril 20 mg, and Effient 10 mg."  No other notes about changes. Please clarify with him if there are further questions as I did not see him last. Thanks.   ----- Message -----    From: Bernita Raisin, RN    Sent: 04/24/2015   7:19 AM      To: Lendon Colonel, NP Subject: clarify med order                              Is he supposed to take lopressor or toprol,unclear to pharmacy

## 2015-05-18 ENCOUNTER — Other Ambulatory Visit: Payer: Self-pay | Admitting: Adult Health

## 2015-08-03 ENCOUNTER — Ambulatory Visit: Payer: Self-pay | Admitting: Endocrinology

## 2015-08-15 ENCOUNTER — Encounter: Payer: Self-pay | Admitting: Endocrinology

## 2015-08-15 ENCOUNTER — Ambulatory Visit (INDEPENDENT_AMBULATORY_CARE_PROVIDER_SITE_OTHER): Payer: BC Managed Care – PPO | Admitting: Endocrinology

## 2015-08-15 VITALS — BP 120/85 | HR 66 | Temp 98.2°F | Ht 69.0 in | Wt 242.0 lb

## 2015-08-15 DIAGNOSIS — E291 Testicular hypofunction: Secondary | ICD-10-CM | POA: Diagnosis not present

## 2015-08-15 NOTE — Patient Instructions (Signed)
Here is a paper, to get your blood tests done Please continue the same medication. Please return in 1 year. normalization of testosterone is not known to harm you.  however, there are "theoretical" risks, including increased fertility, hair loss, prostate cancer, benign prostate enlargement, blood clots, liver problems, lower hdl ("good cholesterol"), polycythemia (opposite of anemia), sleep apnea, and behavior changes.

## 2015-08-15 NOTE — Progress Notes (Signed)
Subjective:    Patient ID: Sean Rivas, male    DOB: [birth year], 44 y.o.   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  Since on the clomid 1/2 per day,  He feels no different.  Main symptom is ED sxs.   Past Medical History  Diagnosis Date  . Coronary artery disease     A.  07/18/2004 - PCI/DES OM2: 2.5x13 Cypher DES Dist/3.0x13 Cypher Prox;   B.  06/2009 & 06/2011- NL Stress Echo EF 70-75%;  C. 09/21/2011 s/p STEMI 2/2 late stent thrombosis -  thrombectomy and PCI/DES 3.0 x 28 mm Promus  . Hyperlipidemia   . Hypertension   . GERD (gastroesophageal reflux disease)   . Tobacco abuse     24 pack year - 1ppd as of 09/21/2011    Past Surgical History  Procedure Laterality Date  . Coronary stent placement    . Left heart catheterization with coronary angiogram N/A 09/21/2011    Procedure: LEFT HEART CATHETERIZATION WITH CORONARY ANGIOGRAM;  Surgeon: Wellington Hampshire, MD;  Location: [facility] CATH [facility];  Service: Cardiovascular;  Laterality: N/A;  . Percutaneous coronary stent intervention (pci-s)  09/21/2011    Procedure: PERCUTANEOUS CORONARY STENT INTERVENTION (PCI-S);  Surgeon: Wellington Hampshire, MD;  Location: [facility];  Service: Cardiovascular;;    Social History   Social History  . Marital Status: Married    Spouse Name: N/A  . Number of Children: N/A  . Years of Education: N/A   Occupational History  . Works @ [employer] History Main Topics  . Smoking status: Former Smoker -- 1.00 packs/day for 24 years    Types: Cigarettes    Quit date: 09/30/2011  . Smokeless tobacco: Never Used  . Alcohol Use: 24.0 oz/week   40 Cans of beer per week     Comment: 8-14 beers daily  . Drug Use: No  . Sexual Activity: Yes   Other Topics Concern  . Not on file   Social History Narrative   Lives in [place] with wife.  Exercises most days of the week - runs/lift weights.    Current Outpatient Prescriptions on File Prior to Visit  Medication Sig Dispense Refill  . allopurinol (ZYLOPRIM) 100 MG tablet Take 1 tablet (100 mg total) by mouth daily. 30 tablet 0  . aspirin EC 81 MG tablet Take 1 tablet (81 mg total) by mouth daily. 30 tablet 0  . clomiPHENE (CLOMID) 50 MG tablet 1/2 tab daily 15 tablet 11  . gemfibrozil (LOPID) 600 MG tablet Take 600 mg by mouth 2 (two) times daily.  3  . lisinopril (PRINIVIL,ZESTRIL) 20 MG tablet TAKE ONE TABLET BY MOUTH EVERY DAY 30 tablet 11  . metoprolol succinate (TOPROL XL) 25 MG 24 hr tablet Take 1 tablet (25 mg total) by mouth daily. 30 tablet 6  . NITROSTAT 0.4 MG SL tablet PLACE 1 TABLET (0.4 MG TOTAL) UNDER THE TONGUE EVERY 5 (FIVE) MINUTES AS NEEDED. FOR CHEST PAIN 25 tablet 3  . pantoprazole (PROTONIX) 40 MG tablet Take 1 tablet (40 mg total) by mouth daily. 30 tablet 0  . prasugrel (EFFIENT) 10  MG TABS tablet Take 1 tablet (10 mg total) by mouth daily. 30 tablet   . simvastatin (ZOCOR) 80 MG tablet Take 80 mg by mouth at bedtime.  3   No current facility-administered medications on file prior to visit.    No Known Allergies  Family History  Problem Relation Age of Onset  . Coronary artery disease Brother 71    s/p stenting  . Alcohol abuse Brother   . Heart failure Brother 52    s/p ICD  . Coronary artery disease Mother 81    deceased @ 22  . Alcohol abuse Father   . Alcohol abuse Paternal Aunt   . Alcohol abuse Paternal Uncle   . Alcohol abuse Paternal Grandfather     BP 120/85 mmHg  Pulse 66  Temp(Src) 98.2 F (36.8 C) (Oral)  Ht 5\' 9"  (1.753 m)  Wt 242 lb (109.77 kg)  BMI 35.72 kg/m2  SpO2 96%  Review of Systems Denies decreased urinary stream     Objective:   Physical Exam VITAL SIGNS:  See vs page.   GENERAL: no distress.  GENITALIA: Normal male testicles, scrotum, and penis.         Assessment & Plan:  Hypogonadism, due for recheck  Patient is advised the following: Patient Instructions  Here is a paper, to get your blood tests done Please continue the same medication. Please return in 1 year. normalization of testosterone is not known to harm you.  however, there are "theoretical" risks, including increased fertility, hair loss, prostate cancer, benign prostate enlargement, blood clots, liver problems, lower hdl ("good cholesterol"), polycythemia (opposite of anemia), sleep apnea, and behavior changes.

## 2015-08-20 ENCOUNTER — Telehealth: Payer: Self-pay | Admitting: Endocrinology

## 2015-08-20 NOTE — Telephone Encounter (Signed)
I contacted the pt and advised of the note below. Requested call back if the pt would like to discuss.

## 2015-08-20 NOTE — Telephone Encounter (Signed)
please call patient: Total testosterone=306 Free testosterone=11.3 (normal 7-22)--good Please continue the same medication. i'll see you next time.

## 2015-08-27 ENCOUNTER — Other Ambulatory Visit: Payer: Self-pay | Admitting: Endocrinology

## 2015-08-27 ENCOUNTER — Other Ambulatory Visit: Payer: Self-pay | Admitting: Adult Health

## 2015-12-30 ENCOUNTER — Other Ambulatory Visit: Payer: Self-pay | Admitting: Cardiovascular Disease

## 2016-03-31 ENCOUNTER — Other Ambulatory Visit: Payer: Self-pay | Admitting: Cardiovascular Disease

## 2016-03-31 ENCOUNTER — Other Ambulatory Visit: Payer: Self-pay

## 2016-03-31 MED ORDER — PRASUGREL HCL 10 MG PO TABS: ORAL_TABLET | ORAL | Status: DC

## 2016-03-31 NOTE — Telephone Encounter (Signed)
Refill complete for 30 day supply.

## 2016-03-31 NOTE — Telephone Encounter (Signed)
Patient called to schedule his f/u. Scheduled for 7/19. Please call in RX for Effient. / tg

## 2016-04-16 ENCOUNTER — Encounter: Payer: Self-pay | Admitting: Cardiovascular Disease

## 2016-04-16 ENCOUNTER — Ambulatory Visit (INDEPENDENT_AMBULATORY_CARE_PROVIDER_SITE_OTHER): Payer: BC Managed Care – PPO | Admitting: Cardiovascular Disease

## 2016-04-16 VITALS — BP 118/80 | HR 56 | Ht 69.0 in | Wt 236.0 lb

## 2016-04-16 DIAGNOSIS — I10 Essential (primary) hypertension: Secondary | ICD-10-CM | POA: Diagnosis not present

## 2016-04-16 DIAGNOSIS — E785 Hyperlipidemia, unspecified: Secondary | ICD-10-CM

## 2016-04-16 DIAGNOSIS — I2510 Atherosclerotic heart disease of native coronary artery without angina pectoris: Secondary | ICD-10-CM

## 2016-04-16 NOTE — Progress Notes (Signed)
Patient ID: Sean Rivas, male   DOB: Oct 09, 1970, 45 y.o.   MRN: [id number]      SUBJECTIVE: The patient presents for follow-up of coronary artery disease. He initially went stenting of the second obtuse marginal branch in October 2005. He then sustained an inferior wall STEMI in December 2012 due to very late stent thrombosis of the distal Cypher stent in the OM 2 with significant disease in the gap between the previously placed stents. He had mild coronary artery disease elsewhere. At that time, he underwent successful thrombectomy and drug-eluting stent placement in the OM 2. The stent partially covered the distal stent as well as the non-stented area between the 2 stents and also overlapped the proximal stent.  Exercise treadmill stress test in 01/2015 was normal and deemed low risk.  Echocardiogram in 01/2015 demonstrated normal left ventricular systolic function and regional wall motion, LVEF 55-60% , with mild left atrial dilatation.  He works for the [employer]. He denies chest pain, palpitations, leg swelling, and shortness of breath. He is trying to get down to 200 pounds and he runs and lifts weights. He occasionally has some right sided musculoskeletal chest wall pain aggravated with movements.  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   Review of Systems: As per "subjective", otherwise negative.  No Known Allergies  Current Outpatient Prescriptions  Medication Sig Dispense Refill  . allopurinol (ZYLOPRIM) 100 MG tablet Take 1 tablet (100 mg total) by mouth daily. 30 tablet 0  . aspirin EC 81 MG tablet Take 1 tablet (81 mg total) by mouth daily. 30 tablet 0  . clomiPHENE (CLOMID) 50 MG tablet TAKE 1/2 TABLET BY MOUTH DAILY 15 tablet 5  . gemfibrozil (LOPID) 600 MG tablet Take 600 mg by mouth 2 (two) times daily.  3  . lisinopril (PRINIVIL,ZESTRIL) 20 MG tablet TAKE ONE  TABLET BY MOUTH EVERY DAY 30 tablet 11  . metoprolol succinate (TOPROL-XL) 25 MG 24 hr tablet TAKE 1 TABLET (25 MG TOTAL) BY MOUTH DAILY. 30 tablet 6  . NITROSTAT 0.4 MG SL tablet PLACE 1 TABLET (0.4 MG TOTAL) UNDER THE TONGUE EVERY 5 (FIVE) MINUTES AS NEEDED. FOR CHEST PAIN 25 tablet 3  . pantoprazole (PROTONIX) 40 MG tablet Take 1 tablet (40 mg total) by mouth daily. 30 tablet 0  . prasugrel (EFFIENT) 10 MG TABS tablet Take 1 tablet (10 mg total) by mouth daily. 30 tablet   . prasugrel (EFFIENT) 10 MG TABS tablet TAKE 1 TABLET (10 MG TOTAL) BY MOUTH DAILY. 30 tablet 0  . simvastatin (ZOCOR) 80 MG tablet Take 80 mg by mouth at bedtime.  3   No current facility-administered medications for this visit.    Past Medical History  Diagnosis Date  . Coronary artery disease     A.  07/18/2004 - PCI/DES OM2: 2.5x13 Cypher DES Dist/3.0x13 Cypher Prox;   B.  06/2009 & 06/2011- NL Stress Echo EF 70-75%;  C. 09/21/2011 s/p STEMI 2/2 late stent thrombosis -  thrombectomy and PCI/DES 3.0 x 28 mm Promus  . Hyperlipidemia   . Hypertension   . GERD (gastroesophageal reflux disease)   . Tobacco abuse     24 pack year - 1ppd as of 09/21/2011    Past Surgical History  Procedure Laterality Date  . Coronary stent placement    . Left heart catheterization with coronary angiogram N/A 09/21/2011    Procedure: LEFT HEART CATHETERIZATION  WITH CORONARY ANGIOGRAM;  Surgeon: Wellington Hampshire, MD;  Location: [facility] C[facility] LAB;  Service: Cardiovascular;  Laterality: N/A;  . Percutaneous coronary stent intervention (pci-s)  09/21/2011    Procedure: PERCUTANEOUS CORONARY STENT INTERVENTION (PCI-S);  Surgeon: Wellington Hampshire, MD;  Location: [facility] CATH LAB;  Service: Cardiovascular;;    Social History   Social History  . Marital Status: Married    Spouse Name: N/A  . Number of Children: N/A  . Years of Education: N/A   Occupational History  . Works @ [employer] History Main Topics  . Smoking  status: Former Smoker -- 1.00 packs/day for 24 years    Types: Cigarettes    Quit date: 09/30/2011  . Smokeless tobacco: Never Used  . Alcohol Use: 24.0 oz/week    40 Cans of beer per week     Comment: 8-14 beers daily  . Drug Use: No  . Sexual Activity: Yes   Other Topics Concern  . Not on file   Social History Narrative   Lives in [place] with wife.  Exercises most days of the week - runs/lift weights.     Filed Vitals:   04/16/16 1051  BP: 118/80  Pulse: 56  Height: 5\' 9"  (1.753 m)  Weight: 236 lb (107.049 kg)  SpO2: 97%    PHYSICAL EXAM General: NAD HEENT: Normal. Neck: No JVD, no thyromegaly. Lungs: Clear to auscultation bilaterally with normal respiratory effort. CV: Nondisplaced PMI.  Regular rate and rhythm, normal S1/S2, no S3/S4, no murmur. No pretibial or periankle edema.  No carotid bruit.   Abdomen: Soft, nontender, no distention.  Neurologic: Alert and oriented.  Psych: Normal affect. Skin: Normal. Musculoskeletal: No gross deformities.    ECG: Most recent ECG reviewed.      ASSESSMENT AND PLAN: 1. CAD with h/o very late stent thrombosis, with three stents in the OM2: Stable ischemic heart disease with normal exercise treadmill stress test and normal LV function. Continue aspirin 81 mg, Toprol-XL 25 mg, simvastatin 80 mg, lisinopril 20 mg, and Effient 10 mg.  2. Essential HTN: Well controlled. No changes.  3. Hyperlipidemia: Obtain copy of lipids from PCP. Continue statin therapy.  Dispo: f/u 1 year.   Kate Sable, M.D., F.A.C.C.

## 2016-04-16 NOTE — Patient Instructions (Signed)
Your physician wants you to follow-up in: 1 year Dr Koneswaran You will receive a reminder letter in the mail two months in advance. If you don't receive a letter, please call our office to schedule the follow-up appointment.     Your physician recommends that you continue on your current medications as directed. Please refer to the Current Medication list given to you today.     Thank you for choosing [facility] !        

## 2016-04-30 ENCOUNTER — Other Ambulatory Visit: Payer: Self-pay | Admitting: Cardiovascular Disease

## 2016-06-16 ENCOUNTER — Telehealth: Payer: Self-pay | Admitting: Cardiovascular Disease

## 2016-06-16 NOTE — Telephone Encounter (Signed)
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

## 2016-06-16 NOTE — Telephone Encounter (Signed)
That would be fine 

## 2016-06-17 ENCOUNTER — Encounter: Payer: Self-pay | Admitting: *Deleted

## 2016-06-17 NOTE — Telephone Encounter (Signed)
Letter printed by L Pinnix LPN,pt made aware,will pick up in office

## 2016-07-28 ENCOUNTER — Other Ambulatory Visit: Payer: Self-pay | Admitting: Cardiovascular Disease

## 2016-08-14 ENCOUNTER — Encounter: Payer: Self-pay | Admitting: Endocrinology

## 2016-08-14 ENCOUNTER — Ambulatory Visit (INDEPENDENT_AMBULATORY_CARE_PROVIDER_SITE_OTHER): Payer: BC Managed Care – PPO | Admitting: Endocrinology

## 2016-08-14 VITALS — BP 124/84 | HR 63 | Ht 69.0 in | Wt 233.0 lb

## 2016-08-14 DIAGNOSIS — Z125 Encounter for screening for malignant neoplasm of prostate: Secondary | ICD-10-CM | POA: Diagnosis not present

## 2016-08-14 DIAGNOSIS — E291 Testicular hypofunction: Secondary | ICD-10-CM

## 2016-08-14 LAB — CBC WITH DIFFERENTIAL/PLATELET
BASOS PCT: 0.4 % (ref 0.0–3.0)
Basophils Absolute: 0 10*3/uL (ref 0.0–0.1)
EOS PCT: 7.9 % — AB (ref 0.0–5.0)
Eosinophils Absolute: 0.5 10*3/uL (ref 0.0–0.7)
HCT: 43.1 % (ref 39.0–52.0)
HEMOGLOBIN: 14.7 g/dL (ref 13.0–17.0)
LYMPHS ABS: 2.3 10*3/uL (ref 0.7–4.0)
Lymphocytes Relative: 34.8 % (ref 12.0–46.0)
MCHC: 34 g/dL (ref 30.0–36.0)
MCV: 86.4 fl (ref 78.0–100.0)
MONO ABS: 0.5 10*3/uL (ref 0.1–1.0)
MONOS PCT: 7.7 % (ref 3.0–12.0)
Neutro Abs: 3.2 10*3/uL (ref 1.4–7.7)
Neutrophils Relative %: 49.2 % (ref 43.0–77.0)
Platelets: 273 10*3/uL (ref 150.0–400.0)
RBC: 4.99 Mil/uL (ref 4.22–5.81)
RDW: 13.4 % (ref 11.5–15.5)
WBC: 6.6 10*3/uL (ref 4.0–10.5)

## 2016-08-14 NOTE — Patient Instructions (Signed)
Here is a paper, to get your blood tests done Please continue the same medication. Testosterone treatment has risks, including increased or decreased fertility (depending on the type of treatment), hair loss, prostate cancer, benign prostate enlargement, blood clots, liver problems, lower hdl ("good cholesterol"), polycythemia (opposite of anemia), sleep apnea, and behavior changes.   Please return in 1 year.

## 2016-08-14 NOTE — Progress Notes (Signed)
Subjective:    Patient ID: Sean Rivas, male    DOB: [birth year], 45 y.o.   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   He feels no different.  Main symptom is ED sxs.  He takes clomid 25 mg approx qod.   Past Medical History:  Diagnosis Date  . Coronary artery disease    A.  07/18/2004 - PCI/DES OM2: 2.5x13 Cypher DES Dist/3.0x13 Cypher Prox;   B.  06/2009 & 06/2011- NL Stress Echo EF 70-75%;  C. 09/21/2011 s/p STEMI 2/2 late stent thrombosis -  thrombectomy and PCI/DES 3.0 x 28 mm Promus  . GERD (gastroesophageal reflux disease)   . Hyperlipidemia   . Hypertension   . Tobacco abuse    24 pack year - 1ppd as of 09/21/2011    Past Surgical History:  Procedure Laterality Date  . CORONARY STENT PLACEMENT    . LEFT HEART CATHETERIZATION WITH CORONARY ANGIOGRAM N/A 09/21/2011   Procedure: LEFT HEART CATHETERIZATION WITH CORONARY ANGIOGRAM;  Surgeon: Wellington Hampshire, MD;  Location: [facility] CATH LAB;  Service: Cardiovascular;  Laterality: N/A;  . PERCUTANEOUS CORONARY STENT INTERVENTION (PCI-S)  09/21/2011   Procedure: PERCUTANEOUS CORONARY STENT INTERVENTION (PCI-S);  Surgeon: Wellington Hampshire, MD;  Location: [facility] CATH LAB;  Service: Cardiovascular;;    Social History   Social History  . Marital status: Married    Spouse name: N/A  . Number of children: N/A  . Years of education: N/A   Occupational History  . Works @ [employer] History Main Topics  . Smoking status: Former Smoker    Packs/day: 1.00    Years: 24.00    Types: Cigarettes    Quit date: 09/30/2011  . Smokeless tobacco: Never Used  . Alcohol use 24.0 oz/week    40 Cans of beer per week     Comment: 8-14 beers daily  . Drug use: No  . Sexual activity: Yes   Other Topics Concern  . Not on file   Social History Narrative   Lives in [place] with wife.  Exercises most days of the week - runs/lift weights.    Current Outpatient Prescriptions on File Prior to Visit  Medication Sig Dispense Refill  . allopurinol (ZYLOPRIM) 100 MG tablet Take 1 tablet (100 mg total) by mouth daily. 30 tablet 0  . aspirin EC 81 MG tablet Take 1 tablet (81 mg total) by mouth daily. 30 tablet 0  . clomiPHENE (CLOMID) 50 MG tablet TAKE 1/2 TABLET BY MOUTH DAILY 15 tablet 5  . EFFIENT 10 MG TABS tablet TAKE 1 TABLET (10 MG TOTAL) BY MOUTH DAILY. 30 tablet 11  . gemfibrozil (LOPID) 600 MG tablet Take 600 mg by mouth 2 (two) times daily.  3  . lisinopril (PRINIVIL,ZESTRIL) 20 MG tablet TAKE ONE TABLET BY MOUTH EVERY DAY 30 tablet 11  . metoprolol succinate (TOPROL-XL) 25 MG 24 hr tablet TAKE 1 TABLET (25 MG TOTAL) BY MOUTH DAILY. 90 tablet 3  . NITROSTAT 0.4 MG SL tablet PLACE 1 TABLET (0.4 MG TOTAL) UNDER THE TONGUE EVERY 5 (FIVE) MINUTES AS NEEDED. FOR CHEST PAIN 25 tablet 3  .  pantoprazole (PROTONIX) 40 MG tablet Take 1 tablet (40 mg total) by mouth daily. 30 tablet 0  . prasugrel (EFFIENT) 10 MG TABS tablet Take 1 tablet (10 mg total) by mouth daily. 30 tablet   . simvastatin (ZOCOR) 80 MG tablet Take 80 mg by mouth at bedtime.  3   No current facility-administered medications on file prior to visit.     No Known Allergies  Family History  Problem Relation Age of Onset  . Coronary artery disease Brother 69    s/p stenting  . Alcohol abuse Brother   . Heart failure Brother 68    s/p ICD  . Coronary artery disease Mother 30    deceased @ 28  . Alcohol abuse Father   . Alcohol abuse Paternal Aunt   . Alcohol abuse Paternal Uncle   . Alcohol abuse Paternal Grandfather     BP 124/84   Pulse 63   Ht 5\' 9"  (1.753 m)   Wt 233 lb (105.7 kg)   SpO2 98%   BMI  34.41 kg/m    Review of Systems Denies decreased urinary stream    Objective:   Physical Exam VITAL SIGNS:  See vs page GENERAL: no distress GENITALIA: Normal male testicles, scrotum, and penis  Lab Results  Component Value Date   TESTOSTERONE 509 08/14/2016      Assessment & Plan:  Hypogonadism: well-controlled on low dosage.  I advised a trial off clomid.

## 2016-08-15 ENCOUNTER — Ambulatory Visit: Payer: Self-pay | Admitting: Endocrinology

## 2016-08-16 LAB — TESTOSTERONE,FREE AND TOTAL
Testosterone, Free: 18.6 pg/mL (ref 6.8–21.5)
Testosterone: 509 ng/dL (ref 264–916)

## 2016-09-07 ENCOUNTER — Other Ambulatory Visit: Payer: Self-pay | Admitting: Adult Health

## 2016-09-30 ENCOUNTER — Other Ambulatory Visit: Payer: Self-pay | Admitting: Endocrinology

## 2017-03-08 ENCOUNTER — Other Ambulatory Visit: Payer: Self-pay | Admitting: Adult Health

## 2017-04-03 ENCOUNTER — Other Ambulatory Visit: Payer: Self-pay | Admitting: Adult Health

## 2017-04-21 ENCOUNTER — Other Ambulatory Visit: Payer: Self-pay | Admitting: Cardiovascular Disease

## 2017-04-24 ENCOUNTER — Ambulatory Visit (INDEPENDENT_AMBULATORY_CARE_PROVIDER_SITE_OTHER): Payer: BC Managed Care – PPO | Admitting: Adult Health

## 2017-04-24 ENCOUNTER — Encounter: Payer: Self-pay | Admitting: Adult Health

## 2017-04-24 ENCOUNTER — Encounter: Payer: Self-pay | Admitting: *Deleted

## 2017-04-24 VITALS — BP 134/90 | HR 77 | Ht 69.0 in | Wt 246.0 lb

## 2017-04-24 DIAGNOSIS — I2510 Atherosclerotic heart disease of native coronary artery without angina pectoris: Secondary | ICD-10-CM

## 2017-04-24 DIAGNOSIS — I10 Essential (primary) hypertension: Secondary | ICD-10-CM

## 2017-04-24 NOTE — Addendum Note (Signed)
Addended by: Levonne Hubert on: 04/24/2017 02:59 PM   Modules accepted: Orders

## 2017-04-24 NOTE — Patient Instructions (Signed)
Medication Instructions:  Your physician recommends that you continue on your current medications as directed. Please refer to the Current Medication list given to you today.   Labwork: NONE  Testing/Procedures: Your physician has requested that you have an exercise tolerance test. For further information please visit [email or web address]. Please also follow instruction sheet, as given.    Follow-Up: Your physician wants you to follow-up in: 1 Year with Dr. Bronson Ing. You will receive a reminder letter in the mail two months in advance. If you don't receive a letter, please call our office to schedule the follow-up appointment.    Any Other Special Instructions Will Be Listed Below (If Applicable).     If you need a refill on your cardiac medications before your next appointment, please call your pharmacy.

## 2017-04-24 NOTE — Progress Notes (Signed)
Cardiology Office Note   Date:  04/24/2017   ID:  Sean Rivas, DOB 1970/10/30, MRN [id number]  PCP:  Rory Percy, MD  Cardiologist:  Bronson Ing Chief Complaint  Patient presents with  . Coronary Artery Disease      History of Present Illness: Sean Rivas is a 46 y.o. male who presents for ongoing assessment and management of coronary artery disease, stent to the second obtuse marginal 2005, late stent thrombosis was noted in 2012, with thrombectomy and drug-eluting stent placement at that time.The stent partially covered the distal stent as well as the non-stented area between the 2 stents and also overlapped the proximal stent. Other history includes hypertension and hyperlipidemia. The patient was last seen by. Dr. Bronson Ing in July 2017. He was to follow-up in one year.   He is here today without any cardiac complaints. He runs 2 miles 3 times a week. He denies recurrent chest pain dyspnea or fatigue. He has not had any hospitalizations over the last year, no new diagnoses, no new allergies, no new medications. He apparently had been on testosterone replacement therapy and this is been discontinued.  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 He is medically compliant.  Past Medical History:  Diagnosis Date  . Coronary artery disease    A.  07/18/2004 - PCI/DES OM2: 2.5x13 Cypher DES Dist/3.0x13 Cypher Prox;   B.  06/2009 & 06/2011- NL Stress Echo EF 70-75%;  C. 09/21/2011 s/p STEMI 2/2 late stent thrombosis -  thrombectomy and PCI/DES 3.0 x 28 mm Promus  . GERD (gastroesophageal reflux disease)   . Hyperlipidemia   . Hypertension   . Tobacco abuse    24 pack year - 1ppd as of 09/21/2011    Past Surgical History:  Procedure Laterality Date  . CORONARY STENT PLACEMENT    . LEFT HEART CATHETERIZATION WITH CORONARY ANGIOGRAM N/A 09/21/2011   Procedure: LEFT HEART CATHETERIZATION WITH CORONARY ANGIOGRAM;  Surgeon: Wellington Hampshire, MD;   Location: [facility] CATH LAB;  Service: Cardiovascular;  Laterality: N/A;  . PERCUTANEOUS CORONARY STENT INTERVENTION (PCI-S)  09/21/2011   Procedure: PERCUTANEOUS CORONARY STENT INTERVENTION (PCI-S);  Surgeon: Wellington Hampshire, MD;  Location: [facility] CATH LAB;  Service: Cardiovascular;;     Current Outpatient Prescriptions  Medication Sig Dispense Refill  . allopurinol (ZYLOPRIM) 100 MG tablet Take 1 tablet (100 mg total) by mouth daily. 30 tablet 0  . aspirin EC 81 MG tablet Take 1 tablet (81 mg total) by mouth daily. 30 tablet 0  . gemfibrozil (LOPID) 600 MG tablet Take 600 mg by mouth 2 (two) times daily.  3  . lisinopril (PRINIVIL,ZESTRIL) 20 MG tablet TAKE ONE TABLET BY MOUTH EVERY DAY 30 tablet 11  . metoprolol succinate (TOPROL-XL) 25 MG 24 hr tablet TAKE 1 TABLET (25 MG TOTAL) BY MOUTH DAILY. 90 tablet 3  . nitroGLYCERIN (NITROSTAT) 0.4 MG SL tablet PLACE 1 TABLET (0.4 MG TOTAL) UNDER THE TONGUE EVERY 5 (FIVE) MINUTES AS NEEDED. FOR CHEST PAIN 25 tablet 1  . pantoprazole (PROTONIX) 40 MG tablet Take 1 tablet (40 mg total) by mouth daily. 30 tablet 0  . prasugrel (EFFIENT) 10 MG TABS tablet Take 1 tablet (10 mg total) by mouth daily. 30 tablet   . simvastatin (ZOCOR) 80 MG tablet Take 80 mg by mouth at bedtime.  3   No current facility-administered medications for this visit.     Allergies:   Patient has no known  allergies.    Social History:  The patient  reports that he quit smoking about 5 years ago. His smoking use included Cigarettes. He has a 24.00 pack-year smoking history. He has never used smokeless tobacco. He reports that he drinks about 24.0 oz of alcohol per week . He reports that he does not use drugs.   Family History:  The patient's family history includes Alcohol abuse in his brother, father, paternal aunt, paternal grandfather, and paternal uncle; Coronary artery disease (age of onset: 31) in his brother; Coronary artery disease (age of onset: 30) in his mother; Heart failure  (age of onset: 6) in his brother.    ROS: All other systems are reviewed and negative. Unless otherwise mentioned in H&P    PHYSICAL EXAM: VS:  BP 134/90   Pulse 77   Ht 5\' 9"  (1.753 m)   Wt 246 lb (111.6 kg)   SpO2 98%   BMI 36.33 kg/m  , BMI Body mass index is 36.33 kg/m. GEN: Well nourished, well developed, in no acute distress  HEENT: normal  Neck: no JVD, carotid bruits, or masses Cardiac: RRR; no murmurs, rubs, or gallops,no edema  Respiratory:  clear to auscultation bilaterally, normal work of breathing GI: soft, nontender, nondistended, + BS MS: no deformity or atrophy  Skin: warm and dry, no rash Neuro:  Strength and sensation are intact Psych: euthymic mood, full affect   Recent Labs: 08/14/2016: Hemoglobin 14.7; Platelets 273.0    Lipid Panel    Component Value Date/Time   CHOL 240 (H) 01/21/2012 0856   TRIG 210 (H) 01/21/2012 0856   HDL 48 01/21/2012 0856   CHOLHDL 5.0 01/21/2012 0856   VLDL 42 (H) 01/21/2012 0856   LDLCALC 150 (H) 01/21/2012 0856      Wt Readings from Last 3 Encounters:  04/24/17 246 lb (111.6 kg)  08/14/16 233 lb (105.7 kg)  04/16/16 236 lb (107 kg)      Other studies Reviewed: Echocardiogram March 12, 2015 Left ventricle: The cavity size was normal. Wall thickness was   normal. Systolic function was normal. The estimated ejection   fraction was in the range of 55% to 60%. Wall motion was normal;   there were no regional wall motion abnormalities. Left   ventricular diastolic function parameters were normal. - Aortic valve: Mildly calcified annulus. Trileaflet; normal   thickness leaflets. Valve area (VTI): 3.04 cm^2. Valve area   (Vmax): 2.74 cm^2. - Mitral valve: Mildly calcified annulus. Normal thickness leaflets   . - Left atrium: The atrium was mildly dilated. - Atrial septum: No defect or patent foramen ovale was identified. - Technically adequate study.   ASSESSMENT AND PLAN:  1. Coronary artery disease: History of  stent to the second obtuse marginal, with history of stent thrombosis in 2012, with thrombectomy and DES placement at that time. Patient has 2 overlapping stents to the proximal stent. He has been without complaints of recurrent chest pain runs 3 times a week 2-3 miles. His medically compliant. We'll continue him on ACE inhibitor, metoprolol, prasugrel and statin therapy. He is due to have labs drawn by primary care physician. We will request copies.  In anticipation of DOT regulations and paperwork, the patient is going to be scheduled for a GXT. Copy of prior stress test has been provided to him to have for his own records. Once GXT is been completed we will send him a copy as well. A letter has been written to his employer stating that he has been  seen today and a GXT is planned.  2. Hypertension: Blood pressures currently well-controlled. He is given refills on his medications.  3. Hypercholesterolemia: Continue statin therapy. Follow-up labs are being ordered by primary care. We'll request records.    Current medicines are reviewed at length with the patient today.    Labs/ tests ordered today include: GXT  Phill Myron. West Pugh, ANP, AACC   04/24/2017 2:35 PM    [facility] [address] [place], [place] [address] Phone: [phone]; Fax: [phone]

## 2017-05-01 ENCOUNTER — Ambulatory Visit (HOSPITAL_COMMUNITY)
Admission: RE | Admit: 2017-05-01 | Discharge: 2017-05-01 | Disposition: A | Discharge status: Home / Self Care | Payer: BC Managed Care – PPO | Source: Ambulatory Visit | Attending: Adult Health | Admitting: Adult Health

## 2017-05-01 DIAGNOSIS — I2510 Atherosclerotic heart disease of native coronary artery without angina pectoris: Secondary | ICD-10-CM | POA: Insufficient documentation

## 2017-05-01 LAB — EXERCISE TOLERANCE TEST
CHL RATE OF PERCEIVED EXERTION: 13
CSEPED: 10 min
CSEPEDS: 1 s
CSEPEW: 13.4 METS
MPHR: 176 {beats}/min
Peak HR: 157 {beats}/min
Percent HR: 89 %
Rest HR: 62 {beats}/min

## 2017-05-04 ENCOUNTER — Telehealth: Payer: Self-pay | Admitting: *Deleted

## 2017-05-04 ENCOUNTER — Encounter: Payer: Self-pay | Admitting: *Deleted

## 2017-05-04 NOTE — Telephone Encounter (Signed)
-----   Message from Lendon Colonel, NP sent at 05/04/2017  7:06 AM EDT ----- Normal stress test. Please print this out and give him a letter for his job that states that his test was normal. He needs it for DOT documentation. Thank you.

## 2017-05-04 NOTE — Telephone Encounter (Signed)
Called patient with test results. No answer. Left message to call back.  

## 2017-05-22 ENCOUNTER — Other Ambulatory Visit: Payer: Self-pay | Admitting: Cardiovascular Disease

## 2017-06-28 ENCOUNTER — Other Ambulatory Visit: Payer: Self-pay | Admitting: Cardiovascular Disease

## 2017-08-08 ENCOUNTER — Other Ambulatory Visit: Payer: Self-pay | Admitting: Adult Health

## 2017-08-14 ENCOUNTER — Ambulatory Visit: Payer: Self-pay | Admitting: Endocrinology

## 2017-08-28 ENCOUNTER — Other Ambulatory Visit: Payer: Self-pay | Admitting: Cardiovascular Disease

## 2017-09-23 ENCOUNTER — Encounter: Payer: Self-pay | Admitting: *Deleted

## 2017-09-23 ENCOUNTER — Encounter: Payer: Self-pay | Admitting: Physician Assistant

## 2017-09-23 ENCOUNTER — Ambulatory Visit: Payer: Self-pay | Admitting: Physician Assistant

## 2017-09-23 VITALS — BP 130/88 | HR 60 | Ht 69.0 in | Wt 237.0 lb

## 2017-09-23 DIAGNOSIS — I10 Essential (primary) hypertension: Secondary | ICD-10-CM

## 2017-09-23 DIAGNOSIS — E785 Hyperlipidemia, unspecified: Secondary | ICD-10-CM

## 2017-09-23 DIAGNOSIS — R079 Chest pain, unspecified: Secondary | ICD-10-CM

## 2017-09-23 DIAGNOSIS — I2510 Atherosclerotic heart disease of native coronary artery without angina pectoris: Secondary | ICD-10-CM

## 2017-09-23 DIAGNOSIS — R0789 Other chest pain: Secondary | ICD-10-CM

## 2017-09-23 MED ORDER — ATORVASTATIN CALCIUM 80 MG PO TABS: 80.0000 mg | ORAL_TABLET | Freq: Every day | ORAL | 3 refills | Status: DC

## 2017-09-23 MED ORDER — ISOSORBIDE MONONITRATE ER 30 MG PO TB24: 30.0000 mg | ORAL_TABLET | Freq: Every day | ORAL | 3 refills | Status: DC

## 2017-09-23 NOTE — Progress Notes (Addendum)
Cardiology Office Note    Date:  09/23/2017  ID:  Sean Rivas, DOB [birth year], MRN [id number] PCP:  Rory Percy, MD  Cardiologist:  Dr. Bronson Ing   Chief Complaint: chest pain  History of Present Illness:  Sean Rivas is a 46 y.o. male with history of CAD (DES to [place] in 2005 with inferior STEMI 2012 due to late stent thrombus s/p DES/thrombectomy), GERD, HTN, HLD, tobacco abuse, alcohol abuse who presents for evaluation of chest pain. Last cath was at time of STEMI at which time his inferior myocardial infarction was felt likely due to very late stent thrombosis of the distal Cypher stent in OM 2 with significant disease in the gap between previous stents. There was mild coronary artery disease otherwise elsewhere. Indefinite DAPT was recommended. He had successful thrombectomy and DES placement in OM 2. The stent basically partially covered the distal stent as well as the non-stented area between the 2 stents and also overlapped with the proximal stent. Saw Jory Sims 03/2017 for annual f/u and DOT examination at which time GXT was normal (10 min). Last CBC in 2017 was normal. Last BMET in [place] was in 07/2013 with Cr 1.01.  He presents for follow-up today with his wife, who encouraged him to have today's visit. For the past 3 weeks he's noticed intermittent chest discomfort during exercise although not always reproducibly so. Sometimes he is able to push himself to higher intensity workouts without any angina at all, and other times he's noticed slight subtle chest discomfort towards the end of his workout that resolves with rest. He has not had any rest pain. No increased SOB, clamminess, nausea, vomiting. He's not had to take any NTG. He remains wary of any heart symptoms because 3 weeks before his STEMI he felt fine except had vague intermittent symptoms at that time too. He feels well today in the office. He does note chronic joint aches. He states his cholesterol was remotely  followed by PCP but hasn't been checked in a while. He continues to drink 4-8 beers nightly but plans to stop after the new year.   Past Medical History:  Diagnosis Date  . Alcohol abuse   . Coronary artery disease    A.  07/18/2004 - PCI/DES OM2: 2.5x13 Cypher DES Dist/3.0x13 Cypher Prox;   B.  06/2009 & 06/2011- NL Stress Echo EF 70-75%;  C. 09/21/2011 s/p STEMI 2/2 late stent thrombosis -  thrombectomy and PCI/DES 3.0 x 28 mm Promus  . GERD (gastroesophageal reflux disease)   . Hyperlipidemia   . Hypertension   . Tobacco abuse    24 pack year - 1ppd as of 09/21/2011    Past Surgical History:  Procedure Laterality Date  . CORONARY STENT PLACEMENT    . LEFT HEART CATHETERIZATION WITH CORONARY ANGIOGRAM N/A 09/21/2011   Procedure: LEFT HEART CATHETERIZATION WITH CORONARY ANGIOGRAM;  Surgeon: Wellington Hampshire, MD;  Location: [facility] CATH LAB;  Service: Cardiovascular;  Laterality: N/A;  . PERCUTANEOUS CORONARY STENT INTERVENTION (PCI-S)  09/21/2011   Procedure: PERCUTANEOUS CORONARY STENT INTERVENTION (PCI-S);  Surgeon: Wellington Hampshire, MD;  Location: [facility] CATH LAB;  Service: Cardiovascular;;    Current Medications: Current Meds  Medication Sig  . allopurinol (ZYLOPRIM) 100 MG tablet Take 1 tablet (100 mg total) by mouth daily.  [name] amoxicillin-clavulanate (AUGMENTIN) 875-125 MG tablet TAKE 1 TABLET BY MOUTH TWICE A DAY FOR SINUSITIS  . aspirin EC 81 MG tablet Take 1 tablet (81 mg total) by mouth daily.  [name]  gemfibrozil (LOPID) 600 MG tablet Take 600 mg by mouth 2 (two) times daily.  [name] lisinopril (PRINIVIL,ZESTRIL) 20 MG tablet TAKE ONE TABLET BY MOUTH EVERY DAY  . lisinopril (PRINIVIL,ZESTRIL) 20 MG tablet TAKE ONE TABLET BY MOUTH EVERY DAY  . metoprolol succinate (TOPROL-XL) 25 MG 24 hr tablet TAKE 1 TABLET (25 MG TOTAL) BY MOUTH DAILY.  . nitroGLYCERIN (NITROSTAT) 0.4 MG SL tablet PLACE 1 TABLET (0.4 MG TOTAL) UNDER THE TONGUE EVERY 5 (FIVE) MINUTES AS NEEDED. FOR CHEST PAIN  .  pantoprazole (PROTONIX) 40 MG tablet Take 1 tablet (40 mg total) by mouth daily.  . prasugrel (EFFIENT) 10 MG TABS tablet Take 1 tablet (10 mg total) by mouth daily.  . simvastatin (ZOCOR) 80 MG tablet Take 80 mg by mouth at bedtime.     Allergies:   Patient has no known allergies.   Social History   Socioeconomic History  . Marital status: Married    Spouse name: None  . Number of children: None  . Years of education: None  . Highest education level: None  Social Needs  . Financial resource strain: None  . Food insecurity - worry: None  . Food insecurity - inability: None  . Transportation needs - medical: None  . Transportation needs - non-medical: None  Occupational History  . 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 Smoking status: Former Smoker    Packs/day: 1.00    Years: 24.00    Pack years: 24.00    Types: Cigarettes    Last attempt to quit: 09/30/2011    Years since quitting: 5.9  . Smokeless tobacco: Never Used  Substance and Sexual Activity  . Alcohol use: Yes    Alcohol/week: 24.0 oz    Types: 40 Cans of beer per week    Comment: 8-14 beers daily  . Drug use: No  . Sexual activity: Yes  Other Topics Concern  . None  Social History Narrative   Lives in [place] with wife.  Exercises most days of the week - runs/lift weights.     Family History:  Family History  Problem Relation Age of Onset  . Coronary artery disease Mother 90       deceased @ 43  . Alcohol abuse Father   . Coronary artery disease Brother 97       s/p stenting  . Alcohol abuse Brother   . Heart failure Brother 28       s/p ICD  . Alcohol abuse Paternal Aunt   . Alcohol abuse Paternal Uncle   . Alcohol abuse Paternal Grandfather    ROS:   Please see the history of present illness.  All other systems are reviewed and otherwise negative.    PHYSICAL EXAM:   VS:  BP 130/88 (BP Location: Left Arm)   Pulse 60   Ht 5\' 9"  (1.753 m)   Wt 237 lb  (107.5 kg)   SpO2 97%   BMI 35.00 kg/m   BMI: Body mass index is 35 kg/m. GEN: Well nourished, well developed fit appearing WM, in no acute distress  HEENT: normocephalic, atraumatic Neck: no JVD, carotid bruits, or masses Cardiac: RRR; no murmurs, rubs, or gallops, no edema  Respiratory:  clear to auscultation bilaterally, normal work of breathing GI: soft, nontender, nondistended, + BS MS: no deformity or atrophy  Skin: warm and dry, no rash Neuro:  Alert and Oriented x 3, Strength and sensation are intact, follows commands Psych:  euthymic mood, full affect  Wt Readings from Last 3 Encounters:  09/23/17 237 lb (107.5 kg)  04/24/17 246 lb (111.6 kg)  08/14/16 233 lb (105.7 kg)      Studies/Labs Reviewed:   EKG:  EKG was ordered today and personally reviewed by me and demonstrates NSR 61bpm, otherwise normal.  Recent Labs: No results found for requested labs within last 8760 hours.   Lipid Panel    Component Value Date/Time   CHOL 240 (H) 01/21/2012 0856   TRIG 210 (H) 01/21/2012 0856   HDL 48 01/21/2012 0856   CHOLHDL 5.0 01/21/2012 0856   VLDL 42 (H) 01/21/2012 0856   LDLCALC 150 (H) 01/21/2012 4742    Additional studies/ records that were reviewed today include: Summarized above.    ASSESSMENT & PLAN:   1. Chest pain - mixed typical/atypical features. Patient is concerned about symptoms as he recalls feeling vague fleeting pains before MI in 2012 as well. He is nervous because he reports a normal ETT shortly before that MI as well. We discussed the mechanism of his MI in 2012 as acute stent thrombosis. EKG today is reassuring and normal. We discussed options of perfusion stress testing versus catheterization. He would like to hold off on cath due to invasive nature of procedure and possibly missing more work than needed. 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 Will add  Imdur 30mg  daily and arrange exercise nuclear stress test along with close f/u. Discussed scaling activity back while this is evaluated and avoiding heavily strenuous activity. Will also check TSH and CBC to exclude metabolic abnormality contributing to symptoms. ER precautions reviewed. Also discussed importance of cutting down alcohol. I encouraged him to speak to PCP about strategies. 2. CAD - as above. Continue ASA, Effient, beta blocker (HR well beta blocked). See below re: statin. 3. HTN - BP mildly above goal, follow with addition of Imdur. Amlodipine could be a second option if needed. 4. Hyperlipidemia - simvastatin 80mg  has now fallen out of favor by FDA due to risk of myopathy. Per guidelines he should be on atorvastatin or rosuvastatin. Will check CMET/FLP today and start atorvastatin 80mg  daily. Repeat FLP/hepatic panel in 6 weeks.  Disposition: F/u with Dr. Adonis Huguenin in 1 week.   Medication Adjustments/Labs and Tests Ordered: Current medicines are reviewed at length with the patient today.  Concerns regarding medicines are outlined above. Medication changes, Labs and Tests ordered today are summarized above and listed in the Patient Instructions accessible in Encounters.   Signed, Charlie Pitter, PA-C  09/23/2017 4:08 PM    [facility] [address], [place], [place]  [address] Phone: [phone]; Fax: [phone]

## 2017-09-23 NOTE — Addendum Note (Signed)
Addended by: Levonne Hubert on: 09/23/2017 05:35 PM   Modules accepted: Orders

## 2017-09-23 NOTE — Patient Instructions (Addendum)
Medication Instructions:  Your physician has recommended you make the following change in your medication:  Stop Taking Simvastatin  Start Taking Lipitor 80 mg Daily  Start Taking Imdur 30 mg Daily    Labwork: Your physician recommends that you return for lab work in: Today   Your physician recommends that you return for lab work in: 6 weeks  Testing/Procedures: Your physician has requested that you have en exercise stress myoview. For further information please visit [email or web address]. Please follow instruction sheet, as given.    Follow-Up: Your physician recommends that you schedule a follow-up appointment in: 1 Week    Any Other Special Instructions Will Be Listed Below (If Applicable).     If you need a refill on your cardiac medications before your next appointment, please call your pharmacy.

## 2017-09-24 LAB — COMPREHENSIVE METABOLIC PANEL
AG Ratio: 2.2 (calc) (ref 1.0–2.5)
ALBUMIN MSPROF: 4.7 g/dL (ref 3.6–5.1)
ALKALINE PHOSPHATASE (APISO): 53 U/L (ref 40–115)
ALT: 31 U/L (ref 9–46)
AST: 28 U/L (ref 10–40)
BILIRUBIN TOTAL: 0.5 mg/dL (ref 0.2–1.2)
BUN: 18 mg/dL (ref 7–25)
CO2: 29 mmol/L (ref 20–32)
Calcium: 9.5 mg/dL (ref 8.6–10.3)
Chloride: 104 mmol/L (ref 98–110)
Creat: 1.07 mg/dL (ref 0.60–1.35)
Globulin: 2.1 g/dL (calc) (ref 1.9–3.7)
Glucose, Bld: 103 mg/dL — ABNORMAL HIGH (ref 65–99)
POTASSIUM: 4.4 mmol/L (ref 3.5–5.3)
Sodium: 139 mmol/L (ref 135–146)
Total Protein: 6.8 g/dL (ref 6.1–8.1)

## 2017-09-24 LAB — CBC WITH DIFFERENTIAL/PLATELET
Basophils Absolute: 17 cells/uL (ref 0–200)
Basophils Relative: 0.3 %
EOS PCT: 7.2 %
Eosinophils Absolute: 418 cells/uL (ref 15–500)
HCT: 42.6 % (ref 38.5–50.0)
Hemoglobin: 14.5 g/dL (ref 13.2–17.1)
Lymphs Abs: 1798 cells/uL (ref 850–3900)
MCH: 29.1 pg (ref 27.0–33.0)
MCHC: 34 g/dL (ref 32.0–36.0)
MCV: 85.5 fL (ref 80.0–100.0)
MPV: 9.5 fL (ref 7.5–12.5)
Monocytes Relative: 9.8 %
NEUTROS PCT: 51.7 %
Neutro Abs: 2999 cells/uL (ref 1500–7800)
PLATELETS: 255 10*3/uL (ref 140–400)
RBC: 4.98 10*6/uL (ref 4.20–5.80)
RDW: 13.1 % (ref 11.0–15.0)
TOTAL LYMPHOCYTE: 31 %
WBC mixed population: 568 cells/uL (ref 200–950)
WBC: 5.8 10*3/uL (ref 3.8–10.8)

## 2017-09-24 LAB — LIPID PANEL
CHOLESTEROL: 202 mg/dL — AB (ref <200)
HDL: 34 mg/dL — AB (ref >40)
LDL Cholesterol (Calc): 124 mg/dL (calc) — ABNORMAL HIGH
NON-HDL CHOLESTEROL (CALC): 168 mg/dL — AB (ref <130)
Total CHOL/HDL Ratio: 5.9 (calc) — ABNORMAL HIGH (ref <5.0)
Triglycerides: 310 mg/dL — ABNORMAL HIGH (ref <150)

## 2017-09-24 LAB — TSH: TSH: 7.45 mIU/L — ABNORMAL HIGH (ref 0.40–4.50)

## 2017-09-25 ENCOUNTER — Encounter (HOSPITAL_COMMUNITY): Payer: Self-pay

## 2017-09-25 ENCOUNTER — Ambulatory Visit (HOSPITAL_COMMUNITY)
Admission: RE | Admit: 2017-09-25 | Discharge: 2017-09-25 | Disposition: A | Discharge status: Home / Self Care | Payer: BC Managed Care – PPO | Source: Ambulatory Visit | Attending: Family Medicine | Admitting: Family Medicine

## 2017-09-25 ENCOUNTER — Ambulatory Visit (HOSPITAL_BASED_OUTPATIENT_CLINIC_OR_DEPARTMENT_OTHER)
Admission: RE | Admit: 2017-09-25 | Discharge: 2017-09-25 | Disposition: A | Discharge status: Home / Self Care | Payer: BC Managed Care – PPO | Source: Ambulatory Visit | Attending: Physician Assistant | Admitting: Physician Assistant

## 2017-09-25 DIAGNOSIS — I10 Essential (primary) hypertension: Secondary | ICD-10-CM

## 2017-09-25 DIAGNOSIS — I2510 Atherosclerotic heart disease of native coronary artery without angina pectoris: Secondary | ICD-10-CM | POA: Insufficient documentation

## 2017-09-25 DIAGNOSIS — R0789 Other chest pain: Secondary | ICD-10-CM | POA: Insufficient documentation

## 2017-09-25 DIAGNOSIS — E785 Hyperlipidemia, unspecified: Secondary | ICD-10-CM

## 2017-09-25 DIAGNOSIS — R079 Chest pain, unspecified: Secondary | ICD-10-CM

## 2017-09-25 LAB — NM MYOCAR MULTI W/SPECT W/WALL MOTION / EF
CHL CUP NUCLEAR SDS: 0
CSEPPHR: 169 {beats}/min
Estimated workload: 13.4 METS
Exercise duration (min): 10 min
Exercise duration (sec): 0 s
LHR: 0.37
LVDIAVOL: 128 mL (ref 62–150)
LVSYSVOL: 55 mL
MPHR: 175 {beats}/min
Percent HR: 96 %
RPE: 13
Rest HR: 61 {beats}/min
SRS: 0
SSS: 0
TID: 0.84

## 2017-09-25 MED ORDER — REGADENOSON 0.4 MG/5ML IV SOLN
INTRAVENOUS | Status: AC
  Filled 2017-09-25: qty 5

## 2017-09-25 MED ORDER — SODIUM CHLORIDE 0.9% FLUSH
INTRAVENOUS | Status: AC
  Administered 2017-09-25: 10 mL via INTRAVENOUS
  Filled 2017-09-25: qty 10

## 2017-09-25 MED ORDER — TECHNETIUM TC 99M TETROFOSMIN IV KIT
10.0000 | PACK | Freq: Once | INTRAVENOUS | Status: AC | PRN
  Administered 2017-09-25: 11 via INTRAVENOUS

## 2017-09-25 MED ORDER — TECHNETIUM TC 99M TETROFOSMIN IV KIT
30.0000 | PACK | Freq: Once | INTRAVENOUS | Status: AC | PRN
  Administered 2017-09-25: 31 via INTRAVENOUS

## 2017-10-02 ENCOUNTER — Ambulatory Visit: Payer: Self-pay | Admitting: Cardiovascular Disease

## 2017-10-05 ENCOUNTER — Telehealth: Payer: Self-pay | Admitting: Physician Assistant

## 2017-10-05 NOTE — Telephone Encounter (Signed)
Received fax from patient's insurance company clarifying lipid regimen. At last OV given CAD we changed him from simvastatin to high dose atorvastatin per guidelines. [facility] inquires about continuing gemfibrozil, as combination should be avoided. Please ask patient to discontinue gemfibrozil for now and to discuss at next office visit with Dr. Bronson Ing next week. Alternatives such as fish oil can be considered. Patient was supposed to have 6 week lipids/hepatic function panel arranged 6 weeks after OV 12/26, please make sure this remains the case. Hannahmarie Asberry PA-C

## 2017-10-05 NOTE — Telephone Encounter (Signed)
Spoke to pt., advised him to stop taking his gemifibrozil until his next appointment with Dr. Bronson Ing. He voiced understanding.

## 2017-10-12 ENCOUNTER — Ambulatory Visit: Payer: BC Managed Care – PPO | Admitting: Cardiovascular Disease

## 2017-10-12 ENCOUNTER — Encounter: Payer: Self-pay | Admitting: Cardiovascular Disease

## 2017-10-12 VITALS — BP 106/66 | HR 61 | Ht 69.0 in | Wt 237.0 lb

## 2017-10-12 DIAGNOSIS — M25512 Pain in left shoulder: Secondary | ICD-10-CM | POA: Diagnosis not present

## 2017-10-12 DIAGNOSIS — E785 Hyperlipidemia, unspecified: Secondary | ICD-10-CM

## 2017-10-12 DIAGNOSIS — M25511 Pain in right shoulder: Secondary | ICD-10-CM | POA: Diagnosis not present

## 2017-10-12 DIAGNOSIS — M791 Myalgia, unspecified site: Secondary | ICD-10-CM | POA: Diagnosis not present

## 2017-10-12 DIAGNOSIS — I25118 Atherosclerotic heart disease of native coronary artery with other forms of angina pectoris: Secondary | ICD-10-CM | POA: Diagnosis not present

## 2017-10-12 DIAGNOSIS — I10 Essential (primary) hypertension: Secondary | ICD-10-CM

## 2017-10-12 DIAGNOSIS — R079 Chest pain, unspecified: Secondary | ICD-10-CM | POA: Diagnosis not present

## 2017-10-12 DIAGNOSIS — Z955 Presence of coronary angioplasty implant and graft: Secondary | ICD-10-CM

## 2017-10-12 NOTE — Addendum Note (Signed)
Addended by: Barbarann Ehlers A on: 10/12/2017 09:49 AM   Modules accepted: Orders

## 2017-10-12 NOTE — Progress Notes (Signed)
SUBJECTIVE: The patient presents for follow-up of chest pain.  He was evaluated by Melina Copa PA-C in our office on 09/23/17.  Imdur 30 mg was added and an exercise nuclear stress test was ordered.  I have not evaluated this patient since 2017.  Past medical history includes coronary artery disease. He initially went stenting of the second obtuse marginal branch in October 2005. He then sustained an inferior wall STEMI in December 2012 due to very late stent thrombosis of the distal Cypher stent in the OM 2 with significant disease in the gap between the previously placed stents. He had mild coronary artery disease elsewhere. At that time, he underwent successful thrombectomy and drug-eluting stent placement in the OM 2. The stent partially covered the distal stent as well as the non-stented area between the 2 stents and also overlapped the proximal stent. At that time it was recommended he continue indefinitely on dual antiplatelet therapy.  Nuclear stress test on 09/25/17 was low risk.  There is no significant myocardial ischemia or scar.  There was a low risk Duke treadmill score of 10.  LVEF 57%.  He also underwent blood tests which included a lipid panel on 09/24/17 which showed elevated total cholesterol of 202, low HDL of 34, elevated triglycerides of 310, and elevated LDL of 124.  He was switched from simvastatin to Lipitor.  TSH was also mildly elevated at 7.45.  CBC was normal.  Comprehensive metabolic panel was normal with exception of mildly elevated glucose.  He is feeling much more reassured after his stress test.  He has stopped eating past 7:00 and is cut back alcohol consumption and his symptoms of chest pain have improved.  He enjoys lifting weights and goes to [employer] in [place].  For the past 2 years, he has noticed increasing joint pains and myalgias.  He said symptoms have been worse in the last 2 months but they preceded his switch from simvastatin to Lipitor.   About a year ago he tried stopping simvastatin for a week but does not remember if there was a change in his symptoms.  He denies shortness of breath, palpitations, orthopnea, and leg swelling.  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  Family history: His brother died of an MI at the age of 29 in 32.  Review of Systems: As per "subjective", otherwise negative.  No Known Allergies  Current Outpatient Medications  Medication Sig Dispense Refill  . allopurinol (ZYLOPRIM) 100 MG tablet Take 1 tablet (100 mg total) by mouth daily. 30 tablet 0  . amoxicillin-clavulanate (AUGMENTIN) 875-125 MG tablet TAKE 1 TABLET BY MOUTH TWICE A DAY FOR SINUSITIS  0  . aspirin EC 81 MG tablet Take 1 tablet (81 mg total) by mouth daily. 30 tablet 0  . atorvastatin (LIPITOR) 80 MG tablet Take 1 tablet (80 mg total) by mouth daily. 90 tablet 3  . gemfibrozil (LOPID) 600 MG tablet Take 600 mg by mouth 2 (two) times daily.  3  . isosorbide mononitrate (IMDUR) 30 MG 24 hr tablet Take 1 tablet (30 mg total) by mouth daily. 90 tablet 3  . lisinopril (PRINIVIL,ZESTRIL) 20 MG tablet TAKE ONE TABLET BY MOUTH EVERY DAY 30 tablet 11  . metoprolol succinate (TOPROL-XL) 25 MG 24 hr tablet TAKE 1 TABLET (25 MG TOTAL) BY MOUTH DAILY. 90 tablet 3  . nitroGLYCERIN (NITROSTAT) 0.4 MG SL tablet PLACE 1 TABLET (0.4 MG TOTAL) UNDER THE TONGUE EVERY  5 (FIVE) MINUTES AS NEEDED. FOR CHEST PAIN 25 tablet 1  . pantoprazole (PROTONIX) 40 MG tablet Take 1 tablet (40 mg total) by mouth daily. 30 tablet 0  . prasugrel (EFFIENT) 10 MG TABS tablet Take 1 tablet (10 mg total) by mouth daily. 30 tablet    No current facility-administered medications for this visit.     Past Medical History:  Diagnosis Date  . Alcohol abuse   . Coronary artery disease    A.  07/18/2004 - PCI/DES OM2: 2.5x13 Cypher DES Dist/3.0x13 Cypher Prox;   B.  06/2009 & 06/2011- NL Stress Echo EF 70-75%;  C. 09/21/2011 s/p STEMI 2/2 late stent  thrombosis -  thrombectomy and PCI/DES 3.0 x 28 mm Promus  . GERD (gastroesophageal reflux disease)   . Hyperlipidemia   . Hypertension   . Tobacco abuse    24 pack year - 1ppd as of 09/21/2011    Past Surgical History:  Procedure Laterality Date  . CORONARY STENT PLACEMENT    . LEFT HEART CATHETERIZATION WITH CORONARY ANGIOGRAM N/A 09/21/2011   Procedure: LEFT HEART CATHETERIZATION WITH CORONARY ANGIOGRAM;  Surgeon: Wellington Hampshire, MD;  Location: [facility] CATH [facility];  Service: Cardiovascular;  Laterality: N/A;  . PERCUTANEOUS CORONARY STENT INTERVENTION (PCI-S)  09/21/2011   Procedure: PERCUTANEOUS CORONARY STENT INTERVENTION (PCI-S);  Surgeon: Wellington Hampshire, MD;  Location: [facility];  Service: Cardiovascular;;    Social History   Socioeconomic History  . Marital status: Married    Spouse name: Not on file  . Number of children: Not on file  . Years of education: Not on file  . Highest education level: Not on file  Social Needs  . Financial resource strain: Not on file  . Food insecurity - worry: Not on file  . Food insecurity - inability: Not on file  . Transportation needs - medical: Not on file  . Transportation needs - non-medical: Not on file  Occupational History  . 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 Smoking status: Former Smoker    Packs/day: 1.00    Years: 24.00    Pack years: 24.00    Types: Cigarettes    Last attempt to quit: 09/30/2011    Years since quitting: 6.0  . Smokeless tobacco: Never Used  Substance and Sexual Activity  . Alcohol use: Yes    Alcohol/week: 24.0 oz    Types: 40 Cans of beer per week    Comment: 8-14 beers daily  . Drug use: No  . Sexual activity: Yes  Other Topics Concern  . Not on file  Social History Narrative   Lives in [place] with wife.  Exercises most days of the week - runs/lift weights.     Vitals:   10/12/17 0910  BP: 106/66  Pulse: 61  SpO2: 96%  Weight: 237 lb (107.5 kg)    Height: 5\' 9"  (1.753 m)    Wt Readings from Last 3 Encounters:  10/12/17 237 lb (107.5 kg)  09/23/17 237 lb (107.5 kg)  04/24/17 246 lb (111.6 kg)     PHYSICAL EXAM General: NAD HEENT: Normal. Neck: No JVD, no thyromegaly. Lungs: Clear to auscultation bilaterally with normal respiratory effort. CV: Regular rate and rhythm, normal S1/S2, no S3/S4, no murmur. No pretibial or periankle edema.  No carotid bruit.   Abdomen: Soft, nontender, no distention.  Neurologic: Alert and oriented.  Psych: Normal affect. Skin: Normal. Musculoskeletal: No gross deformities.  ECG: Most recent ECG reviewed.   Labs: Lab Results  Component Value Date/Time   K 4.4 09/24/2017 08:03 AM   BUN 18 09/24/2017 08:03 AM   CREATININE 1.07 09/24/2017 08:03 AM   ALT 31 09/24/2017 08:03 AM   TSH 7.45 (H) 09/24/2017 08:03 AM   HGB 14.5 09/24/2017 08:03 AM     Lipids: Lab Results  Component Value Date/Time   LDLCALC 150 (H) 01/21/2012 08:56 AM   CHOL 202 (H) 09/24/2017 08:03 AM   TRIG 310 (H) 09/24/2017 08:03 AM   HDL 34 (L) 09/24/2017 08:03 AM       ASSESSMENT AND PLAN: 1.  Chest pain in the context of coronary artery disease with prior history of MI and drug-eluting stents: Nuclear stress test was essentially normal as detailed above.  Symptoms have improved with dietary modification including not eating after 7 PM and decreasing alcohol consumption.  Continue aspirin, Effient, metoprolol succinate, Imdur, and Lipitor. 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  2.  Hypertension: Blood pressure is controlled.  No changes to therapy.  Continue lisinopril and metoprolol succinate at present doses.  3.  Hyperlipidemia: Elevated lipids as detailed above.  He is now on Lipitor 80 mg.  He is due for a repeat lipid panel in the near future.  Due to myalgias and joint pains, I talked to him about potentially stopping statin therapy altogether for 2 weeks to see if this  would improve his symptoms.  He prefers to stay on it for the time being.  I will repeat lipids in March 2019.  If joint pains and myalgias continue to worsen, I would consider switching him to Crestor.  If he were to then continue to have symptoms deemed to be statin mediated, I would consider switching him to PCSK-9 inhibitors.    Disposition: Follow up 6 months.  Time spent: 40 minutes, of which greater than 50% was spent reviewing symptoms, relevant blood tests and studies, and discussing management plan with the patient.    Kate Sable, M.D., F.A.C.C.

## 2017-10-12 NOTE — Patient Instructions (Addendum)
Your physician wants you to follow-up in: 6 months with Dr.Koneswaran You will receive a reminder letter in the mail two months in advance. If you don't receive a letter, please call our office to schedule the follow-up appointment.     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

## 2017-11-04 ENCOUNTER — Telehealth: Payer: Self-pay | Admitting: Cardiovascular Disease

## 2017-11-04 DIAGNOSIS — E785 Hyperlipidemia, unspecified: Secondary | ICD-10-CM

## 2017-11-04 MED ORDER — ROSUVASTATIN CALCIUM 10 MG PO TABS: 10.0000 mg | ORAL_TABLET | Freq: Every day | ORAL | 6 refills | Status: DC

## 2017-11-04 NOTE — Telephone Encounter (Signed)
Have him stay off statins for one week, and then switch to Crestor 10 mg daily. If he can tolerate this, repeat lipids after he has been on Crestor for 8 weeks.

## 2017-11-04 NOTE — Telephone Encounter (Signed)
Patient notified and voiced understanding.

## 2017-11-04 NOTE — Telephone Encounter (Signed)
Dc gemfibrozil.

## 2017-11-04 NOTE — Telephone Encounter (Signed)
Pt and pharmacy notified to D/C Lopid.

## 2017-11-04 NOTE — Telephone Encounter (Signed)
Patient states that new cholesterol medication is causing "pain" in his body / tg

## 2017-11-04 NOTE — Telephone Encounter (Signed)
Please call Curt Bears from [facility] concerning a drug interaction @ [phone]

## 2017-11-04 NOTE — Addendum Note (Signed)
Addended by: Levonne Hubert on: 11/04/2017 04:01 PM   Modules accepted: Orders

## 2017-11-04 NOTE — Telephone Encounter (Signed)
Spoke with pt who states that after starting Lipitor he is having muscle and joint pains. Pt reports that he did take Lipitor for one week.

## 2017-11-04 NOTE — Addendum Note (Signed)
Addended by: Levonne Hubert on: 11/04/2017 01:48 PM   Modules accepted: Orders

## 2017-11-04 NOTE — Telephone Encounter (Signed)
Spoke with Curt Bears from [facility] who states that drug interaction between Crestor and Gemfibrozil is increased muscle pain. Please advise.

## 2018-01-20 ENCOUNTER — Other Ambulatory Visit: Payer: Self-pay | Admitting: *Deleted

## 2018-01-20 ENCOUNTER — Other Ambulatory Visit: Payer: Self-pay | Admitting: Cardiovascular Disease

## 2018-01-20 MED ORDER — NITROGLYCERIN 0.4 MG SL SUBL
SUBLINGUAL_TABLET | SUBLINGUAL | 3 refills | Status: DC
Start: 2018-01-20 — End: 2019-11-21

## 2018-01-20 NOTE — Telephone Encounter (Signed)
Pt is needing a Rx on his nitroGLYCERIN (NITROSTAT) 0.4 MG SL tablet [[id number]]  [facility]   [facility] [place] [address]   [phone]

## 2018-01-20 NOTE — Telephone Encounter (Signed)
[facility]ill placed. [facility]

## 2018-05-14 ENCOUNTER — Emergency Department (HOSPITAL_COMMUNITY): Payer: BC Managed Care – PPO

## 2018-05-14 ENCOUNTER — Encounter (HOSPITAL_COMMUNITY): Payer: Self-pay | Admitting: Emergency Medicine

## 2018-05-14 ENCOUNTER — Emergency Department (HOSPITAL_COMMUNITY)
Admission: EM | Admit: 2018-05-14 | Discharge: 2018-05-14 | Disposition: A | Discharge status: Home / Self Care | Payer: BC Managed Care – PPO | Attending: Emergency Medicine | Admitting: Emergency Medicine

## 2018-05-14 ENCOUNTER — Other Ambulatory Visit: Payer: Self-pay

## 2018-05-14 DIAGNOSIS — R079 Chest pain, unspecified: Secondary | ICD-10-CM | POA: Diagnosis present

## 2018-05-14 DIAGNOSIS — I2510 Atherosclerotic heart disease of native coronary artery without angina pectoris: Secondary | ICD-10-CM | POA: Insufficient documentation

## 2018-05-14 DIAGNOSIS — I252 Old myocardial infarction: Secondary | ICD-10-CM | POA: Insufficient documentation

## 2018-05-14 DIAGNOSIS — I10 Essential (primary) hypertension: Secondary | ICD-10-CM | POA: Diagnosis not present

## 2018-05-14 DIAGNOSIS — Z87891 Personal history of nicotine dependence: Secondary | ICD-10-CM | POA: Diagnosis not present

## 2018-05-14 DIAGNOSIS — Z79899 Other long term (current) drug therapy: Secondary | ICD-10-CM | POA: Diagnosis not present

## 2018-05-14 DIAGNOSIS — R197 Diarrhea, unspecified: Secondary | ICD-10-CM | POA: Insufficient documentation

## 2018-05-14 DIAGNOSIS — Z7982 Long term (current) use of aspirin: Secondary | ICD-10-CM | POA: Diagnosis not present

## 2018-05-14 DIAGNOSIS — R1013 Epigastric pain: Secondary | ICD-10-CM

## 2018-05-14 HISTORY — DX: Non-ST elevation (NSTEMI) myocardial infarction: I21.4

## 2018-05-14 LAB — COMPREHENSIVE METABOLIC PANEL
ALT: 36 U/L (ref 0–44)
ANION GAP: 9 (ref 5–15)
AST: 36 U/L (ref 15–41)
Albumin: 4.3 g/dL (ref 3.5–5.0)
Alkaline Phosphatase: 33 U/L — ABNORMAL LOW (ref 38–126)
BUN: 18 mg/dL (ref 6–20)
CHLORIDE: 101 mmol/L (ref 98–111)
CO2: 24 mmol/L (ref 22–32)
CREATININE: 1.11 mg/dL (ref 0.61–1.24)
Calcium: 9.1 mg/dL (ref 8.9–10.3)
Glucose, Bld: 102 mg/dL — ABNORMAL HIGH (ref 70–99)
Potassium: 4.1 mmol/L (ref 3.5–5.1)
Sodium: 134 mmol/L — ABNORMAL LOW (ref 135–145)
Total Bilirubin: 1.2 mg/dL (ref 0.3–1.2)
Total Protein: 7.7 g/dL (ref 6.5–8.1)

## 2018-05-14 LAB — LIPASE, BLOOD: Lipase: 28 U/L (ref 11–51)

## 2018-05-14 LAB — CBC WITH DIFFERENTIAL/PLATELET
BASOS ABS: 0 10*3/uL (ref 0.0–0.1)
Basophils Relative: 0 %
EOS ABS: 0.1 10*3/uL (ref 0.0–0.7)
EOS PCT: 1 %
HCT: 45 % (ref 39.0–52.0)
Hemoglobin: 15 g/dL (ref 13.0–17.0)
LYMPHS PCT: 8 %
Lymphs Abs: 0.9 10*3/uL (ref 0.7–4.0)
MCH: 29.9 pg (ref 26.0–34.0)
MCHC: 33.3 g/dL (ref 30.0–36.0)
MCV: 89.6 fL (ref 78.0–100.0)
Monocytes Absolute: 0.8 10*3/uL (ref 0.1–1.0)
Monocytes Relative: 7 %
Neutro Abs: 9.9 10*3/uL — ABNORMAL HIGH (ref 1.7–7.7)
Neutrophils Relative %: 84 %
Platelets: 292 10*3/uL (ref 150–400)
RBC: 5.02 MIL/uL (ref 4.22–5.81)
RDW: 14.2 % (ref 11.5–15.5)
WBC: 11.7 10*3/uL — AB (ref 4.0–10.5)

## 2018-05-14 LAB — TROPONIN I

## 2018-05-14 MED ORDER — SODIUM CHLORIDE 0.9 % IV BOLUS
1000.0000 mL | Freq: Once | INTRAVENOUS | Status: AC
  Administered 2018-05-14: 1000 mL via INTRAVENOUS

## 2018-05-14 MED ORDER — GI COCKTAIL ~~LOC~~
30.0000 mL | Freq: Once | ORAL | Status: AC
  Administered 2018-05-14: 30 mL via ORAL
  Filled 2018-05-14: qty 30

## 2018-05-14 MED ORDER — ASPIRIN 81 MG PO CHEW
324.0000 mg | CHEWABLE_TABLET | Freq: Once | ORAL | Status: AC
  Administered 2018-05-14: 324 mg via ORAL
  Filled 2018-05-14: qty 4

## 2018-05-14 NOTE — ED Triage Notes (Signed)
Pt nauseated and had diarrhea last night.  Pain in epigastric that radiates through his chest since he woke up.  Pt has taken nitro x 1 1200 with no relief.

## 2018-05-14 NOTE — Discharge Instructions (Addendum)
If your chest or abdominal pain worsen, you develop vomiting, fever, shortness of breath, or any other new/concerning symptoms and return to the ER for evaluation.  Otherwise follow-up with your primary care physician.

## 2018-05-14 NOTE — ED Provider Notes (Signed)
[facility] EMERGENCY DEPARTMENT Provider Note   CSN: [id number] Arrival date & time: 05/14/18  1330     History   Chief Complaint Chief Complaint  Patient presents with  . Chest Pain    HPI Sean Rivas is a 47 y.o. male.  HPI  48 year old male presents with chest/abdominal pain.  Started when he woke up at 3 AM with significant amount of diarrhea.  No blood in the stools.  Has been having cramping and burning in his upper abdomen/lower chest.  Reminds him of when he had his MI in 2012.  This is how it felt after he had been given the nitroglycerin his pain was improved.  Patient's last stent was in 2012.  However he also states he gets similar burning to his epigastrium after eating but never lasting this long.  He feels like he is short of breath to a degree when talking but does not feel short of breath at rest.  There is no back pain or vomiting.  No diaphoresis.  Pain seems to wax and wane but has been constant since onset.  Diarrhea has stopped. No radiation of pain. Points to epigastrium inferior to sternum as location of main pain.  Past Medical History:  Diagnosis Date  . Alcohol abuse   . Coronary artery disease    A.  07/18/2004 - PCI/DES OM2: 2.5x13 Cypher DES Dist/3.0x13 Cypher Prox;   B.  06/2009 & 06/2011- NL Stress Echo EF 70-75%;  C. 09/21/2011 s/p STEMI 2/2 late stent thrombosis -  thrombectomy and PCI/DES 3.0 x 28 mm Promus  . GERD (gastroesophageal reflux disease)   . Hyperlipidemia   . Hypertension   . MI, acute, non ST segment elevation ([place]) 2012  . Tobacco abuse    24 pack year - 1ppd as of 09/21/2011    Patient Active Problem List   Diagnosis Date Noted  . Screening for prostate cancer 08/14/2016  . Alcohol dependence ([place]) 08/06/2013  . Substance induced mood disorder ([place]) 08/06/2013  . Major depressive disorder, recurrent episode, severe, without mention of psychotic behavior 08/06/2013  . Bereavement 08/06/2013  . Posttraumatic stress  disorder 08/06/2013  . Alcoholism ([place]) 06/22/2012  . Hypogonadism male 06/22/2012  . ST elevation myocardial infarction (STEMI) of inferior wall ([place]) 09/21/2011  . GERD (gastroesophageal reflux disease)   . Hypertension   . Hyperlipidemia   . Coronary artery disease   . Tobacco abuse   . ABDOMINAL PAIN-GENERALIZED 08/16/2009  . ABDOMINAL PAIN-EPIGASTRIC 08/08/2009    Past Surgical History:  Procedure Laterality Date  . CORONARY STENT PLACEMENT    . LEFT HEART CATHETERIZATION WITH CORONARY ANGIOGRAM N/A 09/21/2011   Procedure: LEFT HEART CATHETERIZATION WITH CORONARY ANGIOGRAM;  Surgeon: Wellington Hampshire, MD;  Location: [facility] CATH LAB;  Service: Cardiovascular;  Laterality: N/A;  . PERCUTANEOUS CORONARY STENT INTERVENTION (PCI-S)  09/21/2011   Procedure: PERCUTANEOUS CORONARY STENT INTERVENTION (PCI-S);  Surgeon: Wellington Hampshire, MD;  Location: [facility] CATH LAB;  Service: Cardiovascular;;        Home Medications    Prior to Admission medications   Medication Sig Start Date End Date Taking? Authorizing Provider  allopurinol (ZYLOPRIM) 100 MG tablet Take 1 tablet (100 mg total) by mouth daily. 08/08/13   Patrecia Pour, NP  amoxicillin-clavulanate (AUGMENTIN) 875-125 MG tablet TAKE 1 TABLET BY MOUTH TWICE A DAY FOR SINUSITIS 09/11/17   [provider]  aspirin EC 81 MG tablet Take 1 tablet (81 mg total) by mouth daily. 08/08/13  Patrecia Pour, NP  isosorbide mononitrate (IMDUR) 30 MG 24 hr tablet Take 1 tablet (30 mg total) by mouth daily. 09/23/17 12/22/17  Dunn, Nedra Hai, PA-C  lisinopril (PRINIVIL,ZESTRIL) 20 MG tablet TAKE ONE TABLET BY MOUTH EVERY DAY 05/18/15   Herminio Commons, MD  metoprolol succinate (TOPROL-XL) 25 MG 24 hr tablet TAKE 1 TABLET (25 MG TOTAL) BY MOUTH DAILY. 08/28/17   Herminio Commons, MD  nitroGLYCERIN (NITROSTAT) 0.4 MG SL tablet PLACE 1 TABLET (0.4 MG TOTAL) UNDER THE TONGUE EVERY 5 (FIVE) MINUTES AS NEEDED. FOR CHEST PAIN 01/20/18    Herminio Commons, MD  pantoprazole (PROTONIX) 40 MG tablet Take 1 tablet (40 mg total) by mouth daily. 08/08/13   Patrecia Pour, NP  prasugrel (EFFIENT) 10 MG TABS tablet Take 1 tablet (10 mg total) by mouth daily. 08/08/13   Patrecia Pour, NP  rosuvastatin (CRESTOR) 10 MG tablet Take 1 tablet (10 mg total) by mouth daily. 11/04/17 02/02/18  Herminio Commons, MD    Family History Family History  Problem Relation Age of Onset  . Coronary artery disease Mother 40       deceased @ 49  . Alcohol abuse Father   . Coronary artery disease Brother 4       s/p stenting  . Alcohol abuse Brother   . Heart failure Brother 60       s/p ICD  . Alcohol abuse Paternal Aunt   . Alcohol abuse Paternal Uncle   . Alcohol abuse Paternal Grandfather     Social History Social History   Tobacco Use  . Smoking status: Former Smoker    Packs/day: 1.00    Years: 24.00    Pack years: 24.00    Types: Cigarettes    Last attempt to quit: 09/30/2011    Years since quitting: 6.6  . Smokeless tobacco: Never Used  Substance Use Topics  . Alcohol use: Yes    Alcohol/week: 40.0 standard drinks    Types: 40 Cans of beer per week    Comment: every week   . Drug use: No     Allergies   Patient has no known allergies.   Review of Systems Review of Systems  Constitutional: Negative for diaphoresis.  Respiratory: Positive for shortness of breath.   Cardiovascular: Positive for chest pain.  Gastrointestinal: Positive for abdominal pain and diarrhea. Negative for blood in stool, nausea and vomiting.  Musculoskeletal: Negative for back pain.  All other systems reviewed and are negative.    Physical Exam Updated Vital Signs BP 110/70 (BP Location: Left Arm)   Pulse 78   Temp 98.7 F (37.1 C) (Oral)   Resp 16   Ht 5\' 9"  (1.753 m)   Wt 104.8 kg   SpO2 96%   BMI 34.11 kg/m   Physical Exam  Constitutional: He is oriented to person, place, and time. He appears well-developed and  well-nourished.  Non-toxic appearance. He does not appear ill.  HENT:  Head: Normocephalic and atraumatic.  Right Ear: External ear normal.  Left Ear: External ear normal.  Nose: Nose normal.  Eyes: Right eye exhibits no discharge. Left eye exhibits no discharge.  Neck: Neck supple.  Cardiovascular: Normal rate, regular rhythm and normal heart sounds.  Pulmonary/Chest: Effort normal and breath sounds normal. He exhibits no tenderness.  Abdominal: Soft. He exhibits no distension. There is no tenderness.  Musculoskeletal: He exhibits no edema.  Neurological: He is alert and oriented to person, place, and time.  Skin: Skin is warm and dry. He is not diaphoretic.  Nursing note and vitals reviewed.    ED Treatments / Results  Labs (all labs ordered are listed, but only abnormal results are displayed) Labs Reviewed  COMPREHENSIVE METABOLIC PANEL - Abnormal; Notable for the following components:      Result Value   Sodium 134 (*)    Glucose, Bld 102 (*)    Alkaline Phosphatase 33 (*)    All other components within normal limits  CBC WITH DIFFERENTIAL/PLATELET - Abnormal; Notable for the following components:   WBC 11.7 (*)    Neutro Abs 9.9 (*)    All other components within normal limits  LIPASE, BLOOD  TROPONIN I  TROPONIN I    EKG EKG Interpretation  Date/Time:  Friday May 14 2018 13:39:08 EDT Ventricular Rate:  79 PR Interval:    QRS Duration: 88 QT Interval:  365 QTC Calculation: 419 R Axis:   8 Text Interpretation:  Sinus rhythm nonspecific Flat T waves, I, AVL Confirmed by Sherwood Gambler [phone]) on 05/14/2018 1:49:49 PM   EKG Interpretation  Date/Time:  Friday May 14 2018 13:39:08 EDT Ventricular Rate:  79 PR Interval:    QRS Duration: 88 QT Interval:  365 QTC Calculation: 419 R Axis:   8 Text Interpretation:  Sinus rhythm nonspecific Flat T waves, I, AVL Confirmed by Sherwood Gambler [phone]) on 05/14/2018 1:49:49 PM       EKG  Interpretation  Date/Time:  Friday May 14 2018 16:54:08 EDT Ventricular Rate:  76 PR Interval:    QRS Duration: 88 QT Interval:  371 QTC Calculation: 418 R Axis:   8 Text Interpretation:  Sinus rhythm no significant change since earlier in the day Confirmed by Sherwood Gambler [phone]) on 05/14/2018 5:19:24 PM        Radiology Dg Chest 2 View  Result Date: 05/14/2018 CLINICAL DATA:  Chest pain, nausea, weakness and diarrhea since last night. EXAM: CHEST - 2 VIEW COMPARISON:  08/03/2013. FINDINGS: Normal sized heart. Clear lungs with normal vascularity. Coronary artery stent. Unremarkable bones. IMPRESSION: No acute abnormality. Electronically Signed   By: Claudie Revering M.D.   On: 05/14/2018 14:54    Procedures Procedures (including critical care time)  Medications Ordered in ED Medications  sodium chloride 0.9 % bolus 1,000 mL (0 mLs Intravenous Stopped 05/14/18 1703)  aspirin chewable tablet 324 mg (324 mg Oral Given 05/14/18 1413)  gi cocktail (Maalox,Lidocaine,Donnatal) (30 mLs Oral Given 05/14/18 1413)     Initial Impression / Assessment and Plan / ED Course  I have reviewed the triage vital signs and the nursing notes.  Pertinent labs & imaging results that were available during my care of the patient were reviewed by me and considered in my medical decision making (see chart for details).     Patient's pain resolved after GI cocktail.  Pain is much less than when he had his MI several years ago and appears to be more epigastric with burning.  I think this is likely gastritis, especially combined with the acute diarrhea.  I doubt this is ACS.  Highly doubt PE or dissection.  Do not feel acute imaging such as CT scan would be beneficial.  Labs are reassuring and he has negative troponins x2.  ECGs are unchanging.  With all this, I do not think emergent cardiac work-up or admission is needed.  Follow-up with PCP and we discussed strict return precautions but this is probably  gastritis from an acute GI illness.  Final Clinical Impressions(s) / ED Diagnoses   Final diagnoses:  Epigastric pain    ED Discharge Orders    None       Sherwood Gambler, MD 05/14/18 1821

## 2018-05-15 ENCOUNTER — Other Ambulatory Visit: Payer: Self-pay | Admitting: Cardiovascular Disease

## 2018-06-19 ENCOUNTER — Other Ambulatory Visit: Payer: Self-pay | Admitting: Adult Health

## 2018-07-14 ENCOUNTER — Other Ambulatory Visit: Payer: Self-pay | Admitting: Adult Health

## 2018-07-25 ENCOUNTER — Other Ambulatory Visit: Payer: Self-pay | Admitting: Cardiovascular Disease

## 2018-09-05 ENCOUNTER — Other Ambulatory Visit: Payer: Self-pay | Admitting: Cardiovascular Disease

## 2018-10-08 ENCOUNTER — Other Ambulatory Visit: Payer: Self-pay | Admitting: Cardiovascular Disease

## 2018-10-20 ENCOUNTER — Other Ambulatory Visit: Payer: Self-pay | Admitting: Cardiovascular Disease

## 2018-11-07 ENCOUNTER — Other Ambulatory Visit: Payer: Self-pay | Admitting: Physician Assistant

## 2018-11-15 ENCOUNTER — Other Ambulatory Visit: Payer: Self-pay

## 2018-11-15 MED ORDER — LISINOPRIL 20 MG PO TABS: 20.0000 mg | ORAL_TABLET | Freq: Every day | ORAL | 0 refills | Status: DC

## 2018-11-15 NOTE — Telephone Encounter (Signed)
Overdue for f/u by 1 year, 30 day supply only for lisinopril

## 2018-12-11 ENCOUNTER — Other Ambulatory Visit: Payer: Self-pay | Admitting: Cardiovascular Disease

## 2018-12-16 ENCOUNTER — Ambulatory Visit: Payer: Self-pay | Admitting: Cardiovascular Disease

## 2018-12-16 ENCOUNTER — Other Ambulatory Visit: Payer: Self-pay

## 2018-12-16 ENCOUNTER — Ambulatory Visit: Payer: BC Managed Care – PPO | Admitting: Cardiovascular Disease

## 2018-12-16 ENCOUNTER — Encounter: Payer: Self-pay | Admitting: Cardiovascular Disease

## 2018-12-16 VITALS — BP 126/86 | HR 63 | Temp 98.7°F | Ht 70.0 in | Wt 242.0 lb

## 2018-12-16 DIAGNOSIS — I10 Essential (primary) hypertension: Secondary | ICD-10-CM | POA: Diagnosis not present

## 2018-12-16 DIAGNOSIS — E785 Hyperlipidemia, unspecified: Secondary | ICD-10-CM

## 2018-12-16 DIAGNOSIS — I25118 Atherosclerotic heart disease of native coronary artery with other forms of angina pectoris: Secondary | ICD-10-CM | POA: Diagnosis not present

## 2018-12-16 DIAGNOSIS — Z955 Presence of coronary angioplasty implant and graft: Secondary | ICD-10-CM

## 2018-12-16 MED ORDER — PRASUGREL HCL 10 MG PO TABS: 10.0000 mg | ORAL_TABLET | Freq: Every day | ORAL | 3 refills | Status: DC

## 2018-12-16 NOTE — Patient Instructions (Signed)
Medication Instructions:  Your physician recommends that you continue on your current medications as directed. Please refer to the Current Medication list given to you today.   Labwork: Fasting lipid   Testing/Procedures: none  Follow-Up: Your physician wants you to follow-up in: 1 year.  You will receive a reminder letter in the mail two months in advance. If you don't receive a letter, please call our office to schedule the follow-up appointment.   Any Other Special Instructions Will Be Listed Below (If Applicable).     If you need a refill on your cardiac medications before your next appointment, please call your pharmacy.

## 2018-12-16 NOTE — Progress Notes (Signed)
SUBJECTIVE: The patient presents for follow-up of coronary artery disease. He initially went stenting of the second obtuse marginal branch in October 2005. He then sustained an inferior wall STEMI in December 2012 due to very late stent thrombosis of the distal Cypher stent in the OM 2 with significant disease in the gap between the previously placed stents. He had mild coronary artery disease elsewhere. At that time, he underwent successful thrombectomy and drug-eluting stent placement in the OM 2. The stent partially covered the distal stent as well as the non-stented area between the 2 stents and also overlapped the proximal stent. At that time it was recommended he continue indefinitely on dual antiplatelet therapy.  Nuclear stress test on 09/25/17 was low risk.  There is no significant myocardial ischemia or scar.  There was a low risk Duke treadmill score of 10.  LVEF 57%.  The patient denies any symptoms of chest pain, palpitations, shortness of breath, lightheadedness, dizziness, leg swelling, orthopnea, PND, and syncope.  He has not been bothered by muscle aches or joint pains with Crestor 10 mg.  He is pursuing his bachelor's degree in criminal justice through [employer]  His wife is the Environmental consultant principal of [employer].  His daughter is pursuing her nursing degree.   Review of Systems: As per "subjective", otherwise negative.  No Known Allergies  Current Outpatient Medications  Medication Sig Dispense Refill  . allopurinol (ZYLOPRIM) 100 MG tablet Take 1 tablet (100 mg total) by mouth daily. 30 tablet 0  . aspirin EC 81 MG tablet Take 1 tablet (81 mg total) by mouth daily. 30 tablet 0  . isosorbide mononitrate (IMDUR) 30 MG 24 hr tablet TAKE 1 TABLET BY MOUTH EVERY DAY 90 tablet 3  . lisinopril (PRINIVIL,ZESTRIL) 20 MG tablet TAKE 1 TABLET BY MOUTH EVERY DAY 30 tablet 0  . metoprolol succinate (TOPROL-XL) 25 MG 24 hr tablet TAKE 1  TABLET BY MOUTH EVERY DAY 30 tablet 11  . nitroGLYCERIN (NITROSTAT) 0.4 MG SL tablet PLACE 1 TABLET (0.4 MG TOTAL) UNDER THE TONGUE EVERY 5 (FIVE) MINUTES AS NEEDED. FOR CHEST PAIN 25 tablet 3  . pantoprazole (PROTONIX) 40 MG tablet Take 1 tablet (40 mg total) by mouth daily. 30 tablet 0  . prasugrel (EFFIENT) 10 MG TABS tablet TAKE 1 TABLET BY MOUTH EVERY DAY 30 tablet 11  . rosuvastatin (CRESTOR) 10 MG tablet TAKE 1 TABLET BY MOUTH EVERY DAY 90 tablet 2   No current facility-administered medications for this visit.     Past Medical History:  Diagnosis Date  . Alcohol abuse   . Coronary artery disease    A.  07/18/2004 - PCI/DES OM2: 2.5x13 Cypher DES Dist/3.0x13 Cypher Prox;   B.  06/2009 & 06/2011- NL Stress Echo EF 70-75%;  C. 09/21/2011 s/p STEMI 2/2 late stent thrombosis -  thrombectomy and PCI/DES 3.0 x 28 mm Promus  . GERD (gastroesophageal reflux disease)   . Hyperlipidemia   . Hypertension   . MI, acute, non ST segment elevation ([place]) 2012  . Tobacco abuse    24 pack year - 1ppd as of 09/21/2011    Past Surgical History:  Procedure Laterality Date  . CORONARY STENT PLACEMENT    . LEFT HEART CATHETERIZATION WITH CORONARY ANGIOGRAM N/A 09/21/2011   Procedure: LEFT HEART CATHETERIZATION WITH CORONARY ANGIOGRAM;  Surgeon: Wellington Hampshire, MD;  Location: [facility] CATH LAB;  Service: Cardiovascular;  Laterality: N/A;  . PERCUTANEOUS CORONARY STENT INTERVENTION (  PCI-S)  09/21/2011   Procedure: PERCUTANEOUS CORONARY STENT INTERVENTION (PCI-S);  Surgeon: Wellington Hampshire, MD;  Location: [facility] CATH LAB;  Service: Cardiovascular;;    Social History   Socioeconomic History  . Marital status: Married    Spouse name: Not on file  . Number of children: Not on file  . Years of education: Not on file  . Highest education level: Not on file  Occupational History  . 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 Financial resource strain: Not on file  . Food  insecurity:    Worry: Not on file    Inability: Not on file  . Transportation needs:    Medical: Not on file    Non-medical: Not on file  Tobacco Use  . Smoking status: Former Smoker    Packs/day: 1.00    Years: 24.00    Pack years: 24.00    Types: Cigarettes    Last attempt to quit: 09/30/2011    Years since quitting: 7.2  . Smokeless tobacco: Never Used  Substance and Sexual Activity  . Alcohol use: Yes    Alcohol/week: 40.0 standard drinks    Types: 40 Cans of beer per week    Comment: every week   . Drug use: No  . Sexual activity: Yes  Lifestyle  . Physical activity:    Days per week: Not on file    Minutes per session: Not on file  . Stress: Not on file  Relationships  . Social connections:    Talks on phone: Not on file    Gets together: Not on file    Attends religious service: Not on file    Active member of club or organization: Not on file    Attends meetings of clubs or organizations: Not on file    Relationship status: Not on file  . Intimate partner violence:    Fear of current or ex partner: Not on file    Emotionally abused: Not on file    Physically abused: Not on file    Forced sexual activity: Not on file  Other Topics Concern  . Not on file  Social History Narrative   Lives in [place] with wife.  Exercises most days of the week - runs/lift weights.     Vitals:   12/16/18 1050  BP: 126/86  Pulse: 63  Temp: 98.7 F (37.1 C)  SpO2: 97%  Weight: 242 lb (109.8 kg)  Height: 5\' 10"  (1.778 m)    Wt Readings from Last 3 Encounters:  12/16/18 242 lb (109.8 kg)  05/14/18 231 lb (104.8 kg)  10/12/17 237 lb (107.5 kg)     PHYSICAL EXAM General: NAD HEENT: Normal. Neck: No JVD, no thyromegaly. Lungs: Clear to auscultation bilaterally with normal respiratory effort. CV: Regular rate and rhythm, normal S1/S2, no S3/S4, no murmur. No pretibial or periankle edema.  No carotid bruit.   Abdomen: Soft, nontender, no distention.  Neurologic: Alert  and oriented.  Psych: Normal affect. Skin: Normal. Musculoskeletal: No gross deformities.    ECG: Reviewed above under Subjective   Labs: Lab Results  Component Value Date/Time   K 4.1 05/14/2018 01:58 PM   BUN 18 05/14/2018 01:58 PM   CREATININE 1.11 05/14/2018 01:58 PM   CREATININE 1.07 09/24/2017 08:03 AM   ALT 36 05/14/2018 01:58 PM   TSH 7.45 (H) 09/24/2017 08:03 AM   HGB 15.0 05/14/2018 01:58 PM     Lipids: Lab Results  Component Value Date/Time   LDLCALC 124 (H) 09/24/2017 08:03 AM   CHOL 202 (H) 09/24/2017 08:03 AM   TRIG 310 (H) 09/24/2017 08:03 AM   HDL 34 (L) 09/24/2017 08:03 AM       ASSESSMENT AND PLAN:  1.  Coronary artery disease: Prior history of MI and drug-eluting stents. Nuclear stress test was low risk in December 2018.  Continue aspirin, Effient, metoprolol succinate, Imdur, and Crestor.  It has been recommended that dual antiplatelet therapy be continued indefinitely. 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  2.  Hypertension: Blood pressure is controlled.  No changes to therapy.  Continue lisinopril and metoprolol succinate at present doses.  3.  Hyperlipidemia: I will check lipids.  Continue rosuvastatin 10 mg.   Disposition: Follow up 1 yr   Kate Sable, M.D., F.A.C.C.

## 2019-01-09 ENCOUNTER — Other Ambulatory Visit: Payer: Self-pay | Admitting: Cardiovascular Disease

## 2019-07-18 ENCOUNTER — Other Ambulatory Visit: Payer: Self-pay | Admitting: *Deleted

## 2019-07-18 MED ORDER — ROSUVASTATIN CALCIUM 10 MG PO TABS: 10.0000 mg | ORAL_TABLET | Freq: Every day | ORAL | 3 refills | Status: DC

## 2019-11-03 ENCOUNTER — Encounter: Payer: Self-pay | Admitting: Cardiovascular Disease

## 2019-11-21 ENCOUNTER — Other Ambulatory Visit: Payer: Self-pay | Admitting: Cardiovascular Disease

## 2019-11-21 MED ORDER — NITROGLYCERIN 0.4 MG SL SUBL: SUBLINGUAL_TABLET | SUBLINGUAL | 3 refills | Status: DC

## 2019-11-21 NOTE — Telephone Encounter (Signed)
Refill complete 

## 2019-11-22 ENCOUNTER — Other Ambulatory Visit: Payer: Self-pay | Admitting: Cardiovascular Disease

## 2020-01-11 ENCOUNTER — Encounter: Payer: Self-pay | Admitting: Cardiovascular Disease

## 2020-01-11 ENCOUNTER — Other Ambulatory Visit: Payer: Self-pay

## 2020-01-11 ENCOUNTER — Ambulatory Visit: Payer: BC Managed Care – PPO | Admitting: Cardiovascular Disease

## 2020-01-11 VITALS — BP 140/84 | HR 78 | Temp 97.2°F | Ht 69.0 in | Wt 242.0 lb

## 2020-01-11 DIAGNOSIS — I10 Essential (primary) hypertension: Secondary | ICD-10-CM | POA: Diagnosis not present

## 2020-01-11 DIAGNOSIS — E785 Hyperlipidemia, unspecified: Secondary | ICD-10-CM | POA: Diagnosis not present

## 2020-01-11 DIAGNOSIS — I25118 Atherosclerotic heart disease of native coronary artery with other forms of angina pectoris: Secondary | ICD-10-CM | POA: Diagnosis not present

## 2020-01-11 DIAGNOSIS — Z955 Presence of coronary angioplasty implant and graft: Secondary | ICD-10-CM

## 2020-01-11 NOTE — Progress Notes (Signed)
SUBJECTIVE: The patient presents for follow-up of coronary artery disease. He initially went stenting of the second obtuse marginal branch in October 2005. He then sustained an inferior wall STEMI in December 2012 due to very late stent thrombosis of the distal Cypher stent in the OM 2 with significant disease in the gap between the previously placed stents. He had mild coronary artery disease elsewhere. At that time, he underwent successful thrombectomy and drug-eluting stent placement in the OM 2. The stent partially covered the distal stent as well as the non-stented area between the 2 stents and also overlapped the proximal stent. At that time it was recommended he continue indefinitely on dual antiplatelet therapy.  Nuclear stress test on 09/25/17 was low risk. There is no significant myocardial ischemia or scar. There was a low risk Duke treadmill score of 10. LVEF 57%.  The patient denies any symptoms of chest pain, palpitations, shortness of breath, lightheadedness, dizziness, leg swelling, orthopnea, PND, and syncope.  He recently had his lipids checked by his PCP and is wondering about starting PCSK9 inhibitors.  He finished his bachelor's degree in criminal justice through [employer] and is thinking about pursuing his masters degree.[name]  His wife is the Environmental consultant principal of [employer].  His daughter is pursuing her nursing degree.  Review of Systems: As per "subjective", otherwise negative.  No Known Allergies  Current Outpatient Medications  Medication Sig Dispense Refill  . allopurinol (ZYLOPRIM) 100 MG tablet Take 1 tablet (100 mg total) by mouth daily. 30 tablet 0  . aspirin EC 81 MG tablet Take 1 tablet (81 mg total) by mouth daily. 30 tablet 0  . isosorbide mononitrate (IMDUR) 30 MG 24 hr tablet TAKE 1 TABLET BY MOUTH EVERY DAY 90 tablet 3  . lisinopril (PRINIVIL,ZESTRIL) 20 MG tablet TAKE 1 TABLET BY MOUTH EVERY DAY 90 tablet 3   . metoprolol succinate (TOPROL-XL) 25 MG 24 hr tablet TAKE 1 TABLET BY MOUTH EVERY DAY 90 tablet 3  . nitroGLYCERIN (NITROSTAT) 0.4 MG SL tablet PLACE 1 TABLET (0.4 MG TOTAL) UNDER THE TONGUE EVERY 5 (FIVE) MINUTES AS NEEDED. FOR CHEST PAIN 25 tablet 3  . pantoprazole (PROTONIX) 40 MG tablet Take 1 tablet (40 mg total) by mouth daily. 30 tablet 0  . prasugrel (EFFIENT) 10 MG TABS tablet Take 1 tablet (10 mg total) by mouth daily. 90 tablet 3  . rosuvastatin (CRESTOR) 10 MG tablet Take 1 tablet (10 mg total) by mouth daily. 90 tablet 3  . TESTOSTERONE IM Inject into the muscle.     No current facility-administered medications for this visit.    Past Medical History:  Diagnosis Date  . Alcohol abuse   . Coronary artery disease    A.  07/18/2004 - PCI/DES OM2: 2.5x13 Cypher DES Dist/3.0x13 Cypher Prox;   B.  06/2009 & 06/2011- NL Stress Echo EF 70-75%;  C. 09/21/2011 s/p STEMI 2/2 late stent thrombosis -  thrombectomy and PCI/DES 3.0 x 28 mm Promus  . GERD (gastroesophageal reflux disease)   . Hyperlipidemia   . Hypertension   . MI, acute, non ST segment elevation ([place]) 2012  . Tobacco abuse    24 pack year - 1ppd as of 09/21/2011    Past Surgical History:  Procedure Laterality Date  . CORONARY STENT PLACEMENT    . LEFT HEART CATHETERIZATION WITH CORONARY ANGIOGRAM N/A 09/21/2011   Procedure: LEFT HEART CATHETERIZATION WITH CORONARY ANGIOGRAM;  Surgeon: Wellington Hampshire, MD;  Location: [facility] CATH LAB;  Service: Cardiovascular;  Laterality: N/A;  . PERCUTANEOUS CORONARY STENT INTERVENTION (PCI-S)  09/21/2011   Procedure: PERCUTANEOUS CORONARY STENT INTERVENTION (PCI-S);  Surgeon: Wellington Hampshire, MD;  Location: [facility] CATH LAB;  Service: Cardiovascular;;    Social History   Socioeconomic History  . Marital status: Married    Spouse name: Not on file  . Number of children: Not on file  . Years of education: Not on file  . Highest education level: Not on file  Occupational History  .  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 Smoking status: Former Smoker    Packs/day: 1.00    Years: 24.00    Pack years: 24.00    Types: Cigarettes    Quit date: 09/30/2011    Years since quitting: 8.2  . Smokeless tobacco: Never Used  Substance and Sexual Activity  . Alcohol use: Yes    Alcohol/week: 40.0 standard drinks    Types: 40 Cans of beer per week    Comment: every week   . Drug use: No  . Sexual activity: Yes  Other Topics Concern  . Not on file  Social History Narrative   Lives in [place] with wife.  Exercises most days of the week - runs/lift weights.   Social Determinants of Health   Financial Resource Strain:   . Difficulty of Paying Living Expenses:   Food Insecurity:   . Worried About Charity fundraiser in the Last Year:   . Arboriculturist in the Last Year:   Transportation Needs:   . Film/video editor (Medical):   [name] Lack of Transportation (Non-Medical):   Physical Activity:   . Days of Exercise per Week:   . Minutes of Exercise per Session:   Stress:   . Feeling of Stress :   Social Connections:   . Frequency of Communication with Friends and Family:   . Frequency of Social Gatherings with Friends and Family:   . Attends Religious Services:   . Active Member of Clubs or Organizations:   . Attends Archivist Meetings:   [name] Marital Status:   Intimate Partner Violence:   . Fear of Current or Ex-Partner:   . Emotionally Abused:   [name] Physically Abused:   . Sexually Abused:      Vitals:   01/11/20 1050  BP: 140/84  Pulse: 78  Temp: (!) 97.2 F (36.2 C)  SpO2: 98%  Weight: 242 lb (109.8 kg)  Height: 5\' 9"  (1.753 m)    Wt Readings from Last 3 Encounters:  01/11/20 242 lb (109.8 kg)  12/16/18 242 lb (109.8 kg)  05/14/18 231 lb (104.8 kg)     PHYSICAL EXAM General: NAD HEENT: Normal. Neck: No JVD, no thyromegaly. Lungs: Clear to auscultation bilaterally with normal respiratory  effort. CV: Regular rate and rhythm, normal S1/S2, no S3/S4, no murmur. No pretibial or periankle edema.  No carotid bruit.   Abdomen: Soft, nontender, no distention.  Neurologic: Alert and oriented.  Psych: Normal affect. Skin: Normal. Musculoskeletal: No gross deformities.      Labs: Lab Results  Component Value Date/Time   K 4.1 05/14/2018 01:58 PM   BUN 18 05/14/2018 01:58 PM   CREATININE 1.11 05/14/2018 01:58 PM   CREATININE 1.07 09/24/2017 08:03 AM   ALT 36 05/14/2018 01:58 PM   TSH 7.45 (H) 09/24/2017 08:03 AM   HGB 15.0 05/14/2018 01:58 PM  Lipids: Lab Results  Component Value Date/Time   LDLCALC 124 (H) 09/24/2017 08:03 AM   CHOL 202 (H) 09/24/2017 08:03 AM   TRIG 310 (H) 09/24/2017 08:03 AM   HDL 34 (L) 09/24/2017 08:03 AM       ASSESSMENT AND PLAN:  1. Coronary artery disease: Prior history of MI and drug-eluting stents. Nuclear stress test was low risk in December 2018.  Continue aspirin, Effient, metoprolol succinate, Imdur, and Crestor.  It has been recommended that dual antiplatelet therapy be continued indefinitely.  He needs a GXT for the Department of [employer] which I will schedule for the end of June as per his request.  2. Hypertension: Blood pressure is mildly elevated today but normal at last visit. No changes to therapy. Continue lisinopril and metoprolol succinate at present doses.  3. Hyperlipidemia:  Continue rosuvastatin.  Goal LDL less than 70. He recently had his lipids checked by his PCP and is wondering about starting PCSK9 inhibitors.  I will request lipids from his PCP for personal review.   Disposition: Follow up 1 year   Kate Sable, M.D., F.A.C.C.

## 2020-01-11 NOTE — Patient Instructions (Addendum)
Medication Instructions: Your physician recommends that you continue on your current medications as directed. Please refer to the Current Medication list given to you today.   Labwork: None today  Procedures/Testing: Your physician has requested that you have an exercise tolerance test. For further information please visit [email or web address]. Please also follow instruction sheet, as given.HOLD TOPROL the AM of TEST   Follow-Up: 1 year office visit with Dr.Koneswaran  Any Additional Special Instructions Will Be Listed Below (If Applicable).     If you need a refill on your cardiac medications before your next appointment, please call your pharmacy.

## 2020-01-17 ENCOUNTER — Other Ambulatory Visit: Payer: Self-pay | Admitting: Cardiovascular Disease

## 2020-01-20 ENCOUNTER — Other Ambulatory Visit: Payer: Self-pay | Admitting: Physician Assistant

## 2020-01-20 NOTE — Telephone Encounter (Signed)
This is a Sean Rivas pt.  °

## 2020-01-26 ENCOUNTER — Telehealth: Payer: Self-pay

## 2020-01-26 NOTE — Telephone Encounter (Signed)
Second request sent to Dr.Howard's office today, pt aware

## 2020-01-26 NOTE — Telephone Encounter (Signed)
Pt is asking if bloodwork from his PMD was rec'd.  Pt states he is waiting for further instructions about cholesterol.  Please call [phone]   Thanks renee

## 2020-01-28 ENCOUNTER — Other Ambulatory Visit: Payer: Self-pay | Admitting: Cardiovascular Disease

## 2020-01-30 ENCOUNTER — Telehealth: Payer: Self-pay

## 2020-01-30 DIAGNOSIS — E782 Mixed hyperlipidemia: Secondary | ICD-10-CM

## 2020-01-30 NOTE — Telephone Encounter (Signed)
-----   Message from Herminio Commons, MD sent at 01/27/2020 10:19 AM EDT ----- LDL elevated. Start Repatha.

## 2020-01-30 NOTE — Telephone Encounter (Signed)
Referred to lipid clinic, pt made aware

## 2020-02-13 ENCOUNTER — Other Ambulatory Visit: Payer: Self-pay

## 2020-02-13 ENCOUNTER — Ambulatory Visit (INDEPENDENT_AMBULATORY_CARE_PROVIDER_SITE_OTHER): Payer: BC Managed Care – PPO | Admitting: Pharmacist

## 2020-02-13 DIAGNOSIS — E785 Hyperlipidemia, unspecified: Secondary | ICD-10-CM | POA: Diagnosis not present

## 2020-02-13 MED ORDER — ROSUVASTATIN CALCIUM 20 MG PO TABS: 20.0000 mg | ORAL_TABLET | Freq: Every day | ORAL | 3 refills | Status: DC

## 2020-02-13 NOTE — Progress Notes (Signed)
Patient ID: Sean Rivas                 DOB: 11-Nov-1970                    MRN: [id number]     HPI: Sean Rivas is a 49 y.o. male patient referred to lipid clinic by Dr. Bronson Ing. PMH is significant for CAD s/p stent (2005) then STEMI (08/2011), HLD and HTN. Patient LDL is not at goal of rosuvastatin 10mg  daily. Patient sent to lipid clinic to discuss PCSK9i.  Patient presents today to lipid clinic. He states that he was on atorvastatin 80mg  in the past but had significant muscle aches so it was changed to rosuvastatin 10mg  daily. He is very active and exercises 6-7 days a week. He does drink alcohol and his diet is high in protein. His carb amount fluctuates depending on if he is training for a fitness show or not.   Current Medications: rosuvastatin 10mg  daily Intolerances: atorvastatin 80mg  (achyness), simvastatin 80mg  Risk Factors: progressive ASCVD, HTN, premature CAD LDL goal:<55   Diet: breakfast- eggs, [place] sausage, potatoes Lunch: subway, power bowel or taco bell Dinner:  High protein diet  Exercise: 6-7 days (3 days of cardio, 6 days of weight training)  Family History:  Family History  Problem Relation Age of Onset  . Coronary artery disease Mother 50       deceased @ 37  . Alcohol abuse Father   . Coronary artery disease Brother 48       s/p stenting  . Alcohol abuse Brother   . Heart failure Brother 19       s/p ICD  . Alcohol abuse Paternal Aunt   . Alcohol abuse Paternal Uncle   . Alcohol abuse Paternal Grandfather      Social History: former smoker, ETOH 4 beers per day  Labs: 11/03/19: TC 194, TG 113, HDL 37, LDL 136 (rosuvastatin 10mg  daily)  Past Medical History:  Diagnosis Date  . Alcohol abuse   . Coronary artery disease    A.  07/18/2004 - PCI/DES OM2: 2.5x13 Cypher DES Dist/3.0x13 Cypher Prox;   B.  06/2009 & 06/2011- NL Stress Echo EF 70-75%;  C. 09/21/2011 s/p STEMI 2/2 late stent thrombosis -  thrombectomy and PCI/DES 3.0 x 28 mm  Promus  . GERD (gastroesophageal reflux disease)   . Hyperlipidemia   . Hypertension   . MI, acute, non ST segment elevation ([place]) 2012  . Tobacco abuse    24 pack year - 1ppd as of 09/21/2011    Current Outpatient Medications on File Prior to Visit  Medication Sig Dispense Refill  . allopurinol (ZYLOPRIM) 100 MG tablet Take 1 tablet (100 mg total) by mouth daily. 30 tablet 0  . aspirin EC 81 MG tablet Take 1 tablet (81 mg total) by mouth daily. 30 tablet 0  . isosorbide mononitrate (IMDUR) 30 MG 24 hr tablet TAKE 1 TABLET BY MOUTH EVERY DAY 90 tablet 3  . lisinopril (ZESTRIL) 20 MG tablet TAKE 1 TABLET BY MOUTH EVERY DAY 90 tablet 1  . metoprolol succinate (TOPROL-XL) 25 MG 24 hr tablet TAKE 1 TABLET BY MOUTH EVERY DAY 90 tablet 3  . nitroGLYCERIN (NITROSTAT) 0.4 MG SL tablet PLACE 1 TABLET (0.4 MG TOTAL) UNDER THE TONGUE EVERY 5 (FIVE) MINUTES AS NEEDED. FOR CHEST PAIN 25 tablet 3  . pantoprazole (PROTONIX) 40 MG tablet Take 1 tablet (40 mg total) by mouth daily. 30 tablet 0  .  prasugrel (EFFIENT) 10 MG TABS tablet TAKE 1 TABLET BY MOUTH EVERY DAY 90 tablet 3  . rosuvastatin (CRESTOR) 10 MG tablet Take 1 tablet (10 mg total) by mouth daily. 90 tablet 3  . TESTOSTERONE IM Inject into the muscle.     No current facility-administered medications on file prior to visit.    No Known Allergies  Assessment/Plan:  1. Hyperlipidemia - LDL is above goal of <70 with an ideal goal of <55 due to premature CAD and progressive ASCVD. Discussed with patient increasing rosuvastatin to 20mg  (high intensity) for better cardiovascular risk reduction and LDL lowering. Advised that muscle aches are much more common with atorvastatin due to it being lipophilic. His chances of tolerating higher doses of rosuvastatin are much better since it is hydrophilic. Patient in agreement to try rosuvastatin 20mg . However based off of an LDL of 136, patient will need more than just rosuvastatin to get to goal. Therefore,  will also submit for prior authorization for Repatha. Patient educated on proper injection technique, side effects, prior auth process and cost. Will delay start 2 weeks to make sure patient tolerate higher dose of rosuvastatin. I will call patient in 2 weeks to follow up.   Thank you,  Ramond Dial, Pharm.D, BCPS, CPP [place]  [id number], [place], [place] [address]  Phone: [phone]; Fax: [phone]

## 2020-02-13 NOTE — Patient Instructions (Addendum)
It was a pleasure to meet you!  Please increase your rosuvastatin to 20mg  daily.  I will start a prior authorization for Repatha. I will call you once a determination is made.  I will call in you in 2 weeks to see how you are doing on the rosuvastatin and advise on starting Repatha.  Please call us at [phone] with any questions

## 2020-02-15 ENCOUNTER — Telehealth: Payer: Self-pay | Admitting: Pharmacist

## 2020-02-15 DIAGNOSIS — E785 Hyperlipidemia, unspecified: Secondary | ICD-10-CM

## 2020-02-15 MED ORDER — PRALUENT 75 MG/ML ~~LOC~~ SOAJ: 1.0000 "pen " | SUBCUTANEOUS | 11 refills | Status: DC

## 2020-02-15 NOTE — Telephone Encounter (Signed)
Praluent approved through 02/13/2021. Rx [facility] to [facility]. Copay card activated and faxed to [facility] Called and left VM for patient to call back

## 2020-02-28 NOTE — Telephone Encounter (Signed)
Patient states he is doing well on rosuvastatin. Will start Praluent today. Patient will go to [facility] near his home the beginning of august for repeat labs

## 2020-02-28 NOTE — Addendum Note (Signed)
Addended by: Marcelle Overlie D on: 02/28/2020 03:01 PM   Modules accepted: Orders

## 2020-03-23 ENCOUNTER — Other Ambulatory Visit (HOSPITAL_COMMUNITY)
Admission: RE | Admit: 2020-03-23 | Discharge: 2020-03-23 | Disposition: A | Discharge status: Home / Self Care | Payer: BC Managed Care – PPO | Source: Ambulatory Visit | Attending: Cardiovascular Disease | Admitting: Cardiovascular Disease

## 2020-03-26 ENCOUNTER — Ambulatory Visit (HOSPITAL_COMMUNITY)
Admission: RE | Admit: 2020-03-26 | Discharge: 2020-03-26 | Disposition: A | Discharge status: Home / Self Care | Payer: BC Managed Care – PPO | Source: Ambulatory Visit | Attending: Family Medicine | Admitting: Family Medicine

## 2020-03-26 ENCOUNTER — Other Ambulatory Visit: Payer: Self-pay

## 2020-03-26 DIAGNOSIS — I25118 Atherosclerotic heart disease of native coronary artery with other forms of angina pectoris: Secondary | ICD-10-CM | POA: Diagnosis not present

## 2020-03-26 LAB — EXERCISE TOLERANCE TEST
Estimated workload: 13.7 METS
Exercise duration (min): 11 min
Exercise duration (sec): 7 s
MPHR: 173 {beats}/min
Peak HR: 150 {beats}/min
Percent HR: 86 %
RPE: 15
Rest HR: 65 {beats}/min

## 2020-03-27 ENCOUNTER — Telehealth: Payer: Self-pay | Admitting: Cardiovascular Disease

## 2020-03-27 NOTE — Telephone Encounter (Signed)
Patient would like for results of stress test sent to [place]: Tiajuana Amass FNP-C [phone] (Fax), [email or web address] (Email).

## 2020-05-07 ENCOUNTER — Telehealth: Payer: Self-pay

## 2020-05-07 NOTE — Telephone Encounter (Signed)
Called and spoke w/pt regarding getting labs done and the pt stated that they plan to complete them asap.

## 2020-05-07 NOTE — Telephone Encounter (Signed)
-----   Message from Ramond Dial, [place] sent at 05/07/2020  9:57 AM EDT ----- Can you call and remind him to get labs at [place]? ----- Message ----- From: Ramond Dial, RPH-CPP Sent: 05/03/2020 To: Ramond Dial, RPH-CPP  Lipid labs-[facility]osed to go [facility] near home ----- Message ----- From: Ramond Dial, RPH-CPP Sent: 02/27/2020 To: Ramond Dial, RPH-CPP  How is patient doing on rosuvastatin 20mg ? Start [phone]

## 2020-05-15 ENCOUNTER — Ambulatory Visit: Payer: Self-pay | Admitting: Nurse Practitioner

## 2020-06-26 ENCOUNTER — Telehealth: Payer: Self-pay

## 2020-06-26 DIAGNOSIS — E785 Hyperlipidemia, unspecified: Secondary | ICD-10-CM

## 2020-06-26 NOTE — Telephone Encounter (Signed)
-----   Message from Ramond Dial, [place] sent at 06/25/2020  4:37 PM EDT -----  ----- Message ----- From: Ramond Dial, RPH-CPP Sent: 06/25/2020   4:37 PM EDT To: Ramond Dial, RPH-CPP  Please remind pt again. thanks ----- Message ----- From: Ramond Dial, RPH-CPP Sent: 05/03/2020 To: Ramond Dial, RPH-CPP  Lipid labs-supposed to go to [facility] near home ----- Message ----- From: Ramond Dial, RPH-CPP Sent: 02/27/2020 To: Ramond Dial, RPH-CPP  How is patient doing on rosuvastatin 20mg ? Start [phone]

## 2020-06-26 NOTE — Telephone Encounter (Signed)
Called and lmomed the pt to complete fasting lipid labs that are already ordered at a [facility]

## 2020-06-26 NOTE — Addendum Note (Signed)
Addended by: Allean Found on: 06/26/2020 08:43 AM   Modules accepted: Orders

## 2020-06-26 NOTE — Telephone Encounter (Signed)
Called and spoke w/pt, stated that we would mail him the labs directly so that he can take them into the office w/him to eliminate some confusion. The patient voiced understanding.

## 2020-06-26 NOTE — Telephone Encounter (Signed)
    Pt returning call, he said he went to [facility] at [address], [place] 4 weeks ago and he was told they don't have the order and didn't take his blood. He wanted to make sure if he go there again there will be no problem getting his blood work done

## 2020-07-10 LAB — HEPATIC FUNCTION PANEL
ALT: 39 IU/L (ref 0–44)
AST: 35 IU/L (ref 0–40)
Albumin: 4.8 g/dL (ref 4.0–5.0)
Alkaline Phosphatase: 48 IU/L (ref 44–121)
Bilirubin Total: 0.5 mg/dL (ref 0.0–1.2)
Bilirubin, Direct: 0.14 mg/dL (ref 0.00–0.40)
Total Protein: 7 g/dL (ref 6.0–8.5)

## 2020-07-10 LAB — LIPID PANEL
Chol/HDL Ratio: 2.9 ratio (ref 0.0–5.0)
Cholesterol, Total: 110 mg/dL (ref 100–199)
HDL: 38 mg/dL — ABNORMAL LOW (ref >39)
LDL Chol Calc (NIH): 50 mg/dL (ref 0–99)
Triglycerides: 120 mg/dL (ref 0–149)
VLDL Cholesterol Cal: 22 mg/dL (ref 5–40)

## 2020-08-08 ENCOUNTER — Other Ambulatory Visit: Payer: Self-pay | Admitting: Student

## 2020-08-08 MED ORDER — LISINOPRIL 20 MG PO TABS: 20.0000 mg | ORAL_TABLET | Freq: Every day | ORAL | 2 refills | Status: DC

## 2020-10-15 ENCOUNTER — Ambulatory Visit: Payer: BC Managed Care – PPO | Admitting: Nurse Practitioner

## 2020-10-24 ENCOUNTER — Ambulatory Visit (INDEPENDENT_AMBULATORY_CARE_PROVIDER_SITE_OTHER): Payer: BC Managed Care – PPO | Admitting: Nurse Practitioner

## 2020-10-24 ENCOUNTER — Encounter: Payer: Self-pay | Admitting: Nurse Practitioner

## 2020-10-24 ENCOUNTER — Other Ambulatory Visit: Payer: Self-pay

## 2020-10-24 VITALS — BP 123/78 | HR 86 | Temp 97.4°F | Ht 69.0 in | Wt 238.4 lb

## 2020-10-24 DIAGNOSIS — K2100 Gastro-esophageal reflux disease with esophagitis, without bleeding: Secondary | ICD-10-CM | POA: Diagnosis not present

## 2020-10-24 DIAGNOSIS — E782 Mixed hyperlipidemia: Secondary | ICD-10-CM | POA: Diagnosis not present

## 2020-10-24 DIAGNOSIS — I10 Essential (primary) hypertension: Secondary | ICD-10-CM | POA: Diagnosis not present

## 2020-10-24 NOTE — Patient Instructions (Signed)
Cholesterol Content in Foods Cholesterol is a waxy, fat-like substance that helps to carry fat in the blood. The body needs cholesterol in small amounts, but too much cholesterol can cause damage to the arteries and heart. Most people should eat less than 200 milligrams (mg) of cholesterol a day. Foods with cholesterol Cholesterol is found in animal-based foods, such as meat, seafood, and dairy. Generally, low-fat dairy and lean meats have less cholesterol than full-fat dairy and fatty meats. The milligrams of cholesterol per serving (mg per serving) of common cholesterol-containing foods are listed below. Meat and other proteins  Egg -- one large whole egg has 186 mg.  Veal shank -- 4 oz has 141 mg.  Lean ground [place] (93% lean) -- 4 oz has 118 mg.  Fat-trimmed lamb loin -- 4 oz has 106 mg.  Lean ground beef (90% lean) -- 4 oz has 100 mg.  Lobster -- 3.5 oz has 90 mg.  Pork loin chops -- 4 oz has 86 mg.  Canned salmon -- 3.5 oz has 83 mg.  Fat-trimmed beef top loin -- 4 oz has 78 mg.  Frankfurter -- 1 frank (3.5 oz) has 77 mg.  Crab -- 3.5 oz has 71 mg.  Roasted chicken without skin, white meat -- 4 oz has 66 mg.  Light bologna -- 2 oz has 45 mg.  Deli-cut [place] -- 2 oz has 31 mg.  Canned tuna -- 3.5 oz has 31 mg.  Berniece Salines -- 1 oz has 29 mg.  Oysters and mussels (raw) -- 3.5 oz has 25 mg.  Mackerel -- 1 oz has 22 mg.  Trout -- 1 oz has 20 mg.  Pork sausage -- 1 link (1 oz) has 17 mg.  Salmon -- 1 oz has 16 mg.  Tilapia -- 1 oz has 14 mg. Dairy  Soft-serve ice cream --  cup (4 oz) has 103 mg.  Whole-milk yogurt -- 1 cup (8 oz) has 29 mg.  Cheddar cheese -- 1 oz has 28 mg.  American cheese -- 1 oz has 28 mg.  Whole milk -- 1 cup (8 oz) has 23 mg.  2% milk -- 1 cup (8 oz) has 18 mg.  Cream cheese -- 1 tablespoon (Tbsp) has 15 mg.  Cottage cheese --  cup (4 oz) has 14 mg.  Low-fat (1%) milk -- 1 cup (8 oz) has 10 mg.  Sour cream -- 1 Tbsp  has 8.5 mg.  Low-fat yogurt -- 1 cup (8 oz) has 8 mg.  Nonfat Greek yogurt -- 1 cup (8 oz) has 7 mg.  Half-and-half cream -- 1 Tbsp has 5 mg. Fats and oils  Cod liver oil -- 1 tablespoon (Tbsp) has 82 mg.  Butter -- 1 Tbsp has 15 mg.  Lard -- 1 Tbsp has 14 mg.  Bacon grease -- 1 Tbsp has 14 mg.  Mayonnaise -- 1 Tbsp has 5-10 mg.  Margarine -- 1 Tbsp has 3-10 mg. Exact amounts of cholesterol in these foods may vary depending on specific ingredients and brands.   Foods without cholesterol Most plant-based foods do not have cholesterol unless you combine them with a food that has cholesterol. Foods without cholesterol include:  Grains and cereals.  Vegetables.  Fruits.  Vegetable oils, such as olive, canola, and sunflower oil.  Legumes, such as peas, beans, and lentils.  Nuts and seeds.  Egg whites.   Summary  The body needs cholesterol in small amounts, but too much cholesterol can cause damage  to the arteries and heart.  Most people should eat less than 200 milligrams (mg) of cholesterol a day. This information is not intended to replace advice given to you by your health care provider. Make sure you discuss any questions you have with your health care provider. Document Revised: 02/06/2020 Document Reviewed: 02/06/2020 Elsevier Patient Education  [place].  [employer] Current [employer] 2021 (pp. 213-216). [place], [place]: Elsevier.">  Gastroesophageal Reflux Disease, Adult Gastroesophageal reflux (GER) happens when acid from the stomach flows up into the tube that connects the mouth and the stomach (esophagus). Normally, food travels down the esophagus and stays in the stomach to be digested. However, when a person has GER, food and stomach acid sometimes move back up into the esophagus. If this becomes a more serious problem, the person may be diagnosed with a disease called gastroesophageal reflux disease (GERD). GERD occurs when the reflux:  Happens  often.  Causes frequent or severe symptoms.  Causes problems such as damage to the esophagus. When stomach acid comes in contact with the esophagus, the acid may cause inflammation in the esophagus. Over time, GERD may create small holes (ulcers) in the lining of the esophagus. What are the causes? This condition is caused by a problem with the muscle between the esophagus and the stomach (lower esophageal sphincter, or LES). Normally, the LES muscle closes after food passes through the esophagus to the stomach. When the LES is weakened or abnormal, it does not close properly, and that allows food and stomach acid to go back up into the esophagus. The LES can be weakened by certain dietary substances, medicines, and medical conditions, including:  Tobacco use.  Pregnancy.  Having a hiatal hernia.  Alcohol use.  Certain foods and beverages, such as coffee, chocolate, onions, and peppermint. What increases the risk? You are more likely to develop this condition if you:  Have an increased body weight.  Have a connective tissue disorder.  Take NSAIDs, such as ibuprofen. What are the signs or symptoms? Symptoms of this condition include:  Heartburn.  Difficult or painful swallowing and the feeling of having a lump in the throat.  A bitter taste in the mouth.  Bad breath and having a large amount of saliva.  Having an upset or bloated stomach and belching.  Chest pain. Different conditions can cause chest pain. Make sure you see your health care provider if you experience chest pain.  Shortness of breath or wheezing.  Ongoing (chronic) cough or a nighttime cough.  Wearing away of tooth enamel.  Weight loss. How is this diagnosed? This condition may be diagnosed based on a medical history and a physical exam. To determine if you have mild or severe GERD, your health care provider may also monitor how you respond to treatment. You may also have tests, including:  A test to  examine your stomach and esophagus with a small camera (endoscopy).  A test that measures the acidity level in your esophagus.  A test that measures how much pressure is on your esophagus.  A barium swallow or modified barium swallow test to show the shape, size, and functioning of your esophagus. How is this treated? Treatment for this condition may vary depending on how severe your symptoms are. Your health care provider may recommend:  Changes to your diet.  Medicine.  Surgery. The goal of treatment is to help relieve your symptoms and to prevent complications. Follow these instructions at home: Eating and drinking  Follow a diet as  recommended by your health care provider. This may involve avoiding foods and drinks such as: ? Coffee and tea, with or without caffeine. ? Drinks that contain alcohol. ? Energy drinks and sports drinks. ? Carbonated drinks or sodas. ? Chocolate and cocoa. ? Peppermint and mint flavorings. ? Garlic and onions. ? Horseradish. ? Spicy and acidic foods, including peppers, chili powder, curry powder, vinegar, hot sauces, and barbecue sauce. ? Citrus fruit juices and citrus fruits, such as oranges, lemons, and limes. ? Tomato-based foods, such as red sauce, chili, salsa, and pizza with red sauce. ? Fried and fatty foods, such as donuts, french fries, potato chips, and high-fat dressings. ? High-fat meats, such as hot dogs and fatty cuts of red and white meats, such as rib eye steak, sausage, ham, and bacon. ? High-fat dairy items, such as whole milk, butter, and cream cheese.  Eat small, frequent meals instead of large meals.  Avoid drinking large amounts of liquid with your meals.  Avoid eating meals during the 2-3 hours before bedtime.  Avoid lying down right after you eat.  Do not exercise right after you eat.   Lifestyle  Do not use any products that contain nicotine or tobacco. These products include cigarettes, chewing tobacco, and vaping  devices, such as e-cigarettes. If you need help quitting, ask your health care provider.  Try to reduce your stress by using methods such as yoga or meditation. If you need help reducing stress, ask your health care provider.  If you are overweight, reduce your weight to an amount that is healthy for you. Ask your health care provider for guidance about a safe weight loss goal.   General instructions  Pay attention to any changes in your symptoms.  Take over-the-counter and prescription medicines only as told by your health care provider. Do not take aspirin, ibuprofen, or other NSAIDs unless your health care provider told you to take these medicines.  Wear loose-fitting clothing. Do not wear anything tight around your waist that causes pressure on your abdomen.  Raise (elevate) the head of your bed about 6 inches (15 cm). You can use a wedge to do this.  Avoid bending over if this makes your symptoms worse.  Keep all follow-up visits. This is important. Contact a health care provider if:  You have: ? New symptoms. ? Unexplained weight loss. ? Difficulty swallowing or it hurts to swallow. ? Wheezing or a persistent cough. ? A hoarse voice.  Your symptoms do not improve with treatment. Get help right away if:  You have sudden pain in your arms, neck, jaw, teeth, or back.  You suddenly feel sweaty, dizzy, or light-headed.  You have chest pain or shortness of breath.  You vomit and the vomit is green, yellow, or black, or it looks like blood or coffee grounds.  You faint.  You have stool that is red, bloody, or black.  You cannot swallow, drink, or eat. These symptoms may represent a serious problem that is an emergency. Do not wait to see if the symptoms will go away. Get medical help right away. Call your local emergency services (911 in the [place] Do not drive yourself to the hospital. Summary  Gastroesophageal reflux happens when acid from the stomach flows up into the  esophagus. GERD is a disease in which the reflux happens often, causes frequent or severe symptoms, or causes problems such as damage to the esophagus.  Treatment for this condition may vary depending on how severe your symptoms  are. Your health care provider may recommend diet and lifestyle changes, medicine, or surgery.  Contact a health care provider if you have new or worsening symptoms.  Take over-the-counter and prescription medicines only as told by your health care provider. Do not take aspirin, ibuprofen, or other NSAIDs unless your health care provider told you to do so.  Keep all follow-up visits as told by your health care provider. This is important. This information is not intended to replace advice given to you by your health care provider. Make sure you discuss any questions you have with your health care provider. Document Revised: 03/26/2020 Document Reviewed: 03/26/2020 Elsevier Patient Education  2021 [place]. Hypertension, Adult Hypertension is another name for high blood pressure. High blood pressure forces your heart to work harder to pump blood. This can cause problems over time. There are two numbers in a blood pressure reading. There is a top number (systolic) over a bottom number (diastolic). It is best to have a blood pressure that is below 120/80. Healthy choices can help lower your blood pressure, or you may need medicine to help lower it. What are the causes? The cause of this condition is not known. Some conditions may be related to high blood pressure. What increases the risk?  Smoking.  Having type 2 diabetes mellitus, high cholesterol, or both.  Not getting enough exercise or physical activity.  Being overweight.  Having too much fat, sugar, calories, or salt (sodium) in your diet.  Drinking too much alcohol.  Having long-term (chronic) kidney disease.  Having a family history of high blood pressure.  Age. Risk increases with age.  Race.  You may be at higher risk if you are African American.  Gender. Men are at higher risk than women before age 30. After age 28, women are at higher risk than men.  Having obstructive sleep apnea.  Stress. What are the signs or symptoms?  High blood pressure may not cause symptoms. Very high blood pressure (hypertensive crisis) may cause: ? Headache. ? Feelings of worry or nervousness (anxiety). ? Shortness of breath. ? Nosebleed. ? A feeling of being sick to your stomach (nausea). ? Throwing up (vomiting). ? Changes in how you see. ? Very bad chest pain. ? Seizures. How is this treated?  This condition is treated by making healthy lifestyle changes, such as: ? Eating healthy foods. ? Exercising more. ? Drinking less alcohol.  Your health care provider may prescribe medicine if lifestyle changes are not enough to get your blood pressure under control, and if: ? Your top number is above 130. ? Your bottom number is above 80.  Your personal target blood pressure may vary. Follow these instructions at home: Eating and drinking  If told, follow the DASH eating plan. To follow this plan: ? Fill one half of your plate at each meal with fruits and vegetables. ? Fill one fourth of your plate at each meal with whole grains. Whole grains include whole-wheat pasta, brown rice, and whole-grain bread. ? Eat or drink low-fat dairy products, such as skim milk or low-fat yogurt. ? Fill one fourth of your plate at each meal with low-fat (lean) proteins. Low-fat proteins include fish, chicken without skin, eggs, beans, and tofu. ? Avoid fatty meat, cured and processed meat, or chicken with skin. ? Avoid pre-made or processed food.  Eat less than 1,500 mg of salt each day.  Do not drink alcohol if: ? Your doctor tells you not to drink. ? You are  pregnant, may be pregnant, or are planning to become pregnant.  If you drink alcohol: ? Limit how much you use to:  0-1 drink a day for  women.  0-2 drinks a day for men. ? Be aware of how much alcohol is in your drink. In the [place] one drink equals one 12 oz bottle of beer (355 mL), one 5 oz glass of wine (148 mL), or one 1 oz glass of hard liquor (44 mL).   Lifestyle  Work with your doctor to stay at a healthy weight or to lose weight. Ask your doctor what the best weight is for you.  Get at least 30 minutes of exercise most days of the week. This may include walking, swimming, or biking.  Get at least 30 minutes of exercise that strengthens your muscles (resistance exercise) at least 3 days a week. This may include lifting weights or doing Pilates.  Do not use any products that contain nicotine or tobacco, such as cigarettes, e-cigarettes, and chewing tobacco. If you need help quitting, ask your doctor.  Check your blood pressure at home as told by your doctor.  Keep all follow-up visits as told by your doctor. This is important.   Medicines  Take over-the-counter and prescription medicines only as told by your doctor. Follow directions carefully.  Do not skip doses of blood pressure medicine. The medicine does not work as well if you skip doses. Skipping doses also puts you at risk for problems.  Ask your doctor about side effects or reactions to medicines that you should watch for. Contact a doctor if you:  Think you are having a reaction to the medicine you are taking.  Have headaches that keep coming back (recurring).  Feel dizzy.  Have swelling in your ankles.  Have trouble with your vision. Get help right away if you:  Get a very bad headache.  Start to feel mixed up (confused).  Feel weak or numb.  Feel faint.  Have very bad pain in your: ? Chest. ? Belly (abdomen).  Throw up more than once.  Have trouble breathing. Summary  Hypertension is another name for high blood pressure.  High blood pressure forces your heart to work harder to pump blood.  For most people, a normal blood  pressure is less than 120/80.  Making healthy choices can help lower blood pressure. If your blood pressure does not get lower with healthy choices, you may need to take medicine. This information is not intended to replace advice given to you by your health care provider. Make sure you discuss any questions you have with your health care provider. Document Revised: 05/26/2018 Document Reviewed: 05/26/2018 Elsevier Patient Education  2021 [employer].

## 2020-10-24 NOTE — Assessment & Plan Note (Signed)
Hypertension well controlled on current medication no changes to medication dose at this time.  Lisinopril 20 mg tablet by mouth daily and Metroprolol 25 mg tablet by mouth daily.

## 2020-10-24 NOTE — Assessment & Plan Note (Signed)
Hyperlipidemia well-controlled on current medication Crestor 20 mg tablet by mouth daily.  Continue low-cholesterol diet and exercise regimen as tolerated.  Follow-up follow-up in 75-month

## 2020-10-24 NOTE — Assessment & Plan Note (Signed)
Gastroesophageal reflux disease well controlled on 20 mg Protonix daily.  Continue healthy diet and exercise regimen.

## 2020-10-24 NOTE — Progress Notes (Signed)
New Patient Note  RE: Sean Rivas MRN: [id number] DOB: 08/11/1971 Date of Office Visit: 10/24/2020  Chief Complaint: New Patient (Initial Visit)  History of Present Illness:   Patient presents for follow up of hypertension. Patient was diagnosed in 2014 . The patient is tolerating the medication well without side effects. Compliance with treatment has been good; including taking medication as directed , maintains a healthy diet and regular exercise regimen , and following up as directed.  Current medication Metroprolol 25 mg tablet by mouth every day, lisinopril 20 mg tablet by mouth daily.  Mixed hyperlipidemia  Pt presents with hyperlipidemia. Patient was diagnosed in 12/31/2012. Compliance with treatment has been good; The patient is compliant with medications, maintains a low cholesterol diet , follows up as directed , and maintains an exercise regimen . The patient denies experiencing any hypercholesterolemia related symptoms.  Current medication Crestor 20 mg tablet by mouth daily.  GERD, Follow up:  The patient was last seen for GERD 3 years ago. Changes made since that visit include. No recent changes  Sean Rivas reports good compliance with treatment. Sean Rivas is not having side effects.    Sean Rivas is NOT experiencing abdominal bloating, chest pain, choking on food, cough, deep pressure at base of neck or difficulty swallowing  -----------------------------------------------------------------------------------------   Assessment and Plan: Sean Rivas is a 50 y.o. male with: Hypertension Hypertension well controlled on current medication no changes to medication dose at this time.  Lisinopril 20 mg tablet by mouth daily and Metroprolol 25 mg tablet by mouth daily.  Hyperlipidemia Hyperlipidemia well-controlled on current medication Crestor 20 mg tablet by mouth daily.  Continue low-cholesterol diet and exercise regimen as tolerated.  Follow-up follow-up in 67-month  GERD (gastroesophageal  reflux disease) Gastroesophageal reflux disease well controlled on 20 mg Protonix daily.  Continue healthy diet and exercise regimen.  Return in about 3 months (around 01/22/2021).   Diagnostics:   Past Medical History: Patient Active Problem List   Diagnosis Date Noted  . Screening for prostate cancer 08/14/2016  . Alcohol dependence (HCC) 08/06/2013  . Substance induced mood disorder (HCC) 08/06/2013  . Major depressive disorder, recurrent episode, severe, without mention of psychotic behavior 08/06/2013  . Bereavement 08/06/2013  . Posttraumatic stress disorder 08/06/2013  . Alcoholism (HCC) 06/22/2012  . Hypogonadism male 06/22/2012  . ST elevation myocardial infarction (STEMI) of inferior wall (HCC) 09/21/2011  . GERD (gastroesophageal reflux disease)   . Hypertension   . Hyperlipidemia   . Coronary artery disease   . Tobacco abuse   . ABDOMINAL PAIN-GENERALIZED 08/16/2009  . ABDOMINAL PAIN-EPIGASTRIC 08/08/2009   Past Medical History:  Diagnosis Date  . Alcohol abuse   . Coronary artery disease    A.  07/18/2004 - PCI/DES OM2: 2.5x13 Cypher DES Dist/3.0x13 Cypher Prox;   B.  06/2009 & 06/2011- NL Stress Echo EF 70-75%;  C. 09/21/2011 s/p STEMI 2/2 late stent thrombosis -  thrombectomy and PCI/DES 3.0 x 28 mm Promus  . GERD (gastroesophageal reflux disease)   . Hyperlipidemia   . Hypertension   . MI, acute, non ST segment elevation (HCC) 2012  . Tobacco abuse    24 pack year - 1ppd as of 09/21/2011   Past Surgical History: Past Surgical History:  Procedure Laterality Date  . CORONARY STENT PLACEMENT    . LEFT HEART CATHETERIZATION WITH CORONARY ANGIOGRAM N/A 09/21/2011   Procedure: LEFT HEART CATHETERIZATION WITH CORONARY ANGIOGRAM;  Surgeon: Iran Ouch, MD;  Location: [facility] CATH LAB;  Service: Cardiovascular;  Laterality: N/A;  . PERCUTANEOUS CORONARY STENT INTERVENTION (PCI-S)  09/21/2011   Procedure: PERCUTANEOUS CORONARY STENT INTERVENTION (PCI-S);  Surgeon:  Wellington Hampshire, MD;  Location: [facility] CATH LAB;  Service: Cardiovascular;;   Medication List:  Current Outpatient Medications  Medication Sig Dispense Refill  . Alirocumab (PRALUENT) 75 MG/ML SOAJ Inject 1 pen into the skin every 14 (fourteen) days. 2 pen 11  . aspirin EC 81 MG tablet Take 1 tablet (81 mg total) by mouth daily. 30 tablet 0  . lisinopril (ZESTRIL) 20 MG tablet Take 1 tablet (20 mg total) by mouth daily. 90 tablet 2  . metoprolol succinate (TOPROL-XL) 25 MG 24 hr tablet TAKE 1 TABLET BY MOUTH EVERY DAY 90 tablet 3  . nitroGLYCERIN (NITROSTAT) 0.4 MG SL tablet PLACE 1 TABLET (0.4 MG TOTAL) UNDER THE TONGUE EVERY 5 (FIVE) MINUTES AS NEEDED. FOR CHEST PAIN 25 tablet 3  . pantoprazole (PROTONIX) 40 MG tablet Take 1 tablet (40 mg total) by mouth daily. 30 tablet 0  . prasugrel (EFFIENT) 10 MG TABS tablet TAKE 1 TABLET BY MOUTH EVERY DAY 90 tablet 3  . TESTOSTERONE IM Inject into the muscle.    . allopurinol (ZYLOPRIM) 100 MG tablet Take 1 tablet (100 mg total) by mouth daily. (Patient not taking: Reported on 10/24/2020) 30 tablet 0  . isosorbide mononitrate (IMDUR) 30 MG 24 hr tablet TAKE 1 TABLET BY MOUTH EVERY DAY (Patient not taking: Reported on 10/24/2020) 90 tablet 3  . rosuvastatin (CRESTOR) 20 MG tablet Take 1 tablet (20 mg total) by mouth daily. 90 tablet 3   No current facility-administered medications for this visit.   Allergies: No Known Allergies Social History: Social History   Socioeconomic History  . Marital status: Married    Spouse name: Not on file  . Number of children: Not on file  . Years of education: Not on file  . Highest education level: Not on file  Occupational History  . 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 Smoking status: Former Smoker    Packs/day: 1.00    Years: 24.00    Pack years: 24.00    Types: Cigarettes    Quit date: 09/30/2011    Years since quitting: 9.0  . Smokeless tobacco: Never Used   Vaping Use  . Vaping Use: Never used  Substance and Sexual Activity  . Alcohol use: Yes    Alcohol/week: 40.0 standard drinks    Types: 40 Cans of beer per week    Comment: every week   . Drug use: No  . Sexual activity: Yes  Other Topics Concern  . Not on file  Social History Narrative   Lives in [place] with wife.  Exercises most days of the week - runs/lift weights.   Social Determinants of Health   Financial Resource Strain: Not on file  Food Insecurity: Not on file  Transportation Needs: Not on file  Physical Activity: Not on file  Stress: Not on file  Social Connections: Not on file       Family History: Family History  Problem Relation Age of Onset  . Coronary artery disease Mother 95       deceased @ 33  . Alcohol abuse Father   . Coronary artery disease Brother 54       s/p stenting  . Alcohol abuse Brother   . Heart failure Brother 2       s/p ICD  . Alcohol abuse Paternal  Aunt   . Alcohol abuse Paternal Uncle   . Alcohol abuse Paternal Grandfather          Review of Systems  Constitutional: Negative.   Respiratory: Negative.   Cardiovascular: Negative.   Gastrointestinal: Negative.  Negative for constipation, diarrhea, nausea and vomiting.  Genitourinary: Negative.   Musculoskeletal: Negative.   Skin: Negative.   All other systems reviewed and are negative.  Objective: BP 123/78   Pulse 86   Temp (!) 97.4 F (36.3 C)   Ht 5\' 9"  (1.753 m)   Wt 238 lb 6.4 oz (108.1 kg)   SpO2 97%   BMI 35.21 kg/m  Body mass index is 35.21 kg/m. Physical Exam Vitals reviewed.  Constitutional:      Appearance: Normal appearance.  HENT:     Head: Normocephalic.     Nose: Nose normal.  Eyes:     Conjunctiva/sclera: Conjunctivae normal.  Cardiovascular:     Rate and Rhythm: Normal rate and regular rhythm.     Pulses: Normal pulses.     Heart sounds: Normal heart sounds.  Pulmonary:     Effort: Pulmonary effort is normal.     Breath sounds: Normal  breath sounds.  Abdominal:     General: Bowel sounds are normal.  Musculoskeletal:        General: Normal range of motion.  Skin:    General: Skin is warm.  Neurological:     Mental Status: Sean Rivas is alert and oriented to person, place, and time.  Psychiatric:        Mood and Affect: Mood normal.        Behavior: Behavior normal.    The plan was reviewed with the patient/family, and all questions/concerned were addressed.  It was my pleasure to see Sean Rivas today and participate in his care. Please feel free to contact me with any questions or concerns.  Sincerely,  Jac Canavan NP [place]

## 2020-11-13 ENCOUNTER — Telehealth (INDEPENDENT_AMBULATORY_CARE_PROVIDER_SITE_OTHER): Payer: BC Managed Care – PPO | Admitting: Nurse Practitioner

## 2020-11-13 ENCOUNTER — Encounter: Payer: Self-pay | Admitting: Nurse Practitioner

## 2020-11-13 DIAGNOSIS — J069 Acute upper respiratory infection, unspecified: Secondary | ICD-10-CM | POA: Diagnosis not present

## 2020-11-13 MED ORDER — AZITHROMYCIN 250 MG PO TABS: ORAL_TABLET | ORAL | 0 refills | Status: DC

## 2020-11-13 MED ORDER — DM-GUAIFENESIN ER 30-600 MG PO TB12: 1.0000 | ORAL_TABLET | Freq: Two times a day (BID) | ORAL | 0 refills | Status: DC

## 2020-11-13 NOTE — Assessment & Plan Note (Signed)
Patient tested positive for COVID-19.  Symptoms of worsening congestion, cough and sinus pressure with headache.  Patient is afebrile today.  Denies chills, nausea, vomiting or diarrhea. Advised patient to increase hydration, monitor oxygen saturation. Started patient on a Z-Pak, Tylenol for headache, guaifenesin for cough and congestion.  Follow-up with worsening or unresolved symptoms.

## 2020-11-13 NOTE — Progress Notes (Signed)
Virtual Visit via Video Note   This visit type was conducted due to national recommendations for restrictions regarding the COVID-19 Pandemic (e.g. social distancing) in an effort to limit this patient's exposure and mitigate transmission in our community.  Due to his co-morbid illnesses, this patient is at least at moderate risk for complications without adequate follow up.  This format is felt to be most appropriate for this patient at this time.  All issues noted in this document were discussed and addressed.  A limited physical exam was performed with this format.  A verbal consent was obtained for the virtual visit.   Date:  11/13/2020   ID:  Ulla Potash, DOB 08/12/1971, MRN [id number]  Patient Location: Home Provider Location: Office/Clinic  PCP:  Ivy Lynn, NP   Evaluation Performed:  Acute visit  Chief Complaint: Upper respiratory infection  History of Present Illness:    Sean Rivas is a 50 y.o. male with URI  This is a new problem. The current episode started in the past 7 days. The problem has been unchanged. There has been no fever. Associated symptoms include congestion, coughing, headaches and sinus pain. Pertinent negatives include no abdominal pain, chest pain, diarrhea, nausea, sore throat or swollen glands. He has tried acetaminophen for the symptoms. The treatment provided no relief.    The patient does have symptoms concerning for COVID-19 infection (fever, chills, cough, or new shortness of breath).    Past Medical History:  Diagnosis Date  . Alcohol abuse   . Coronary artery disease    A.  07/18/2004 - PCI/DES OM2: 2.5x13 Cypher DES Dist/3.0x13 Cypher Prox;   B.  06/2009 & 06/2011- NL Stress Echo EF 70-75%;  C. 09/21/2011 s/p STEMI 2/2 late stent thrombosis -  thrombectomy and PCI/DES 3.0 x 28 mm Promus  . GERD (gastroesophageal reflux disease)   . Hyperlipidemia   . Hypertension   . MI, acute, non ST segment elevation ([place]) 2012  . Tobacco  abuse    24 pack year - 1ppd as of 09/21/2011    Past Surgical History:  Procedure Laterality Date  . CORONARY STENT PLACEMENT    . LEFT HEART CATHETERIZATION WITH CORONARY ANGIOGRAM N/A 09/21/2011   Procedure: LEFT HEART CATHETERIZATION WITH CORONARY ANGIOGRAM;  Surgeon: Wellington Hampshire, MD;  Location: [facility] CATH LAB;  Service: Cardiovascular;  Laterality: N/A;  . PERCUTANEOUS CORONARY STENT INTERVENTION (PCI-S)  09/21/2011   Procedure: PERCUTANEOUS CORONARY STENT INTERVENTION (PCI-S);  Surgeon: Wellington Hampshire, MD;  Location: [facility] C[facility] LAB;  Service: Cardiovascular;;    Family History  Problem Relation Age of Onset  . Coronary artery disease Mother 64       deceased @ 38  . Alcohol abuse Father   . Coronary artery disease Brother 83       s/p stenting  . Alcohol abuse Brother   . Heart failure Brother 68       s/p ICD  . Alcohol abuse Paternal Aunt   . Alcohol abuse Paternal Uncle   . Alcohol abuse Paternal Grandfather     Social History   Socioeconomic History  . Marital status: Married    Spouse name: Not on file  . Number of children: Not on file  . Years of education: Not on file  . Highest education level: Not on file  Occupational History  . 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 Smoking status: Former Smoker  Packs/day: 1.00    Years: 24.00    Pack years: 24.00    Types: Cigarettes    Quit date: 09/30/2011    Years since quitting: 9.1  . Smokeless tobacco: Never Used  Vaping Use  . Vaping Use: Never used  Substance and Sexual Activity  . Alcohol use: Yes    Alcohol/week: 40.0 standard drinks    Types: 40 Cans of beer per week    Comment: every week   . Drug use: No  . Sexual activity: Yes  Other Topics Concern  . Not on file  Social History Narrative   Lives in [place] with wife.  Exercises most days of the week - runs/lift weights.   Social Determinants of Health   Financial Resource Strain: Not on file   Food Insecurity: Not on file  Transportation Needs: Not on file  Physical Activity: Not on file  Stress: Not on file  Social Connections: Not on file  Intimate Partner Violence: Not on file    Outpatient Medications Prior to Visit  Medication Sig Dispense Refill  . Alirocumab (PRALUENT) 75 MG/ML SOAJ Inject 1 pen into the skin every 14 (fourteen) days. 2 pen 11  . allopurinol (ZYLOPRIM) 100 MG tablet Take 1 tablet (100 mg total) by mouth daily. (Patient not taking: Reported on 10/24/2020) 30 tablet 0  . aspirin EC 81 MG tablet Take 1 tablet (81 mg total) by mouth daily. 30 tablet 0  . isosorbide mononitrate (IMDUR) 30 MG 24 hr tablet TAKE 1 TABLET BY MOUTH EVERY DAY (Patient not taking: Reported on 10/24/2020) 90 tablet 3  . lisinopril (ZESTRIL) 20 MG tablet Take 1 tablet (20 mg total) by mouth daily. 90 tablet 2  . metoprolol succinate (TOPROL-XL) 25 MG 24 hr tablet TAKE 1 TABLET BY MOUTH EVERY DAY 90 tablet 3  . nitroGLYCERIN (NITROSTAT) 0.4 MG SL tablet PLACE 1 TABLET (0.4 MG TOTAL) UNDER THE TONGUE EVERY 5 (FIVE) MINUTES AS NEEDED. FOR CHEST PAIN 25 tablet 3  . pantoprazole (PROTONIX) 40 MG tablet Take 1 tablet (40 mg total) by mouth daily. 30 tablet 0  . prasugrel (EFFIENT) 10 MG TABS tablet TAKE 1 TABLET BY MOUTH EVERY DAY 90 tablet 3  . rosuvastatin (CRESTOR) 20 MG tablet Take 1 tablet (20 mg total) by mouth daily. 90 tablet 3  . TESTOSTERONE IM Inject into the muscle.     No facility-administered medications prior to visit.    Allergies:   Patient has no known allergies.   Social History   Tobacco Use  . Smoking status: Former Smoker    Packs/day: 1.00    Years: 24.00    Pack years: 24.00    Types: Cigarettes    Quit date: 09/30/2011    Years since quitting: 9.1  . Smokeless tobacco: Never Used  Vaping Use  . Vaping Use: Never used  Substance Use Topics  . Alcohol use: Yes    Alcohol/week: 40.0 standard drinks    Types: 40 Cans of beer per week    Comment: every week    . Drug use: No     Review of Systems  Constitutional: Negative for chills, fever and malaise/fatigue.  HENT: Positive for congestion and sinus pain. Negative for sore throat.   Respiratory: Positive for cough.   Cardiovascular: Negative for chest pain.  Gastrointestinal: Negative for abdominal pain, diarrhea and nausea.  Musculoskeletal: Negative for myalgias.  Neurological: Positive for headaches.  All other systems reviewed and are negative.    Labs/Other  Tests and Data Reviewed:    Recent Labs: 07/09/2020: ALT 39   Recent Lipid Panel Lab Results  Component Value Date/Time   CHOL 110 07/09/2020 08:06 AM   TRIG 120 07/09/2020 08:06 AM   HDL 38 (L) 07/09/2020 08:06 AM   CHOLHDL 2.9 07/09/2020 08:06 AM   CHOLHDL 5.9 (H) 09/24/2017 08:03 AM   LDLCALC 50 07/09/2020 08:06 AM   LDLCALC 124 (H) 09/24/2017 08:03 AM    Wt Readings from Last 3 Encounters:  10/24/20 238 lb 6.4 oz (108.1 kg)  01/11/20 242 lb (109.8 kg)  12/16/18 242 lb (109.8 kg)     Objective:    Vital Signs:  There were no vitals taken for this visit.   Physical Exam   Virtual visit-patient did not look to be in distress.   ASSESSMENT & PLAN:   1. Upper respiratory tract infection, unspecified type - azithromycin (ZITHROMAX) 250 MG tablet; 500 mg tablet [2 tablet] day 1, 250 mg tablet [1 tablet] day 2-5  Dispense: 1 each; Refill: 0 - dextromethorphan-guaiFENesin (MUCINEX DM) 30-600 MG 12hr tablet; Take 1 tablet by mouth 2 (two) times daily.  Dispense: 30 tablet; Refill: 0       Meds ordered this encounter  Medications  . azithromycin (ZITHROMAX) 250 MG tablet    Sig: 500 mg tablet [2 tablet] day 1, 250 mg tablet [1 tablet] day 2-5    Dispense:  1 each    Refill:  0    Order Specific Question:   Supervising Provider    Answer:   Janora Norlander [[id number]]  . dextromethorphan-guaiFENesin (MUCINEX DM) 30-600 MG 12hr tablet    Sig: Take 1 tablet by mouth 2 (two) times daily.    Dispense:  30  tablet    Refill:  0    Order Specific Question:   Supervising Provider    Answer:   Janora Norlander [[id number]]    COVID-19 Education: The signs and symptoms of COVID-19 were discussed with the patient and how to seek care for testing (follow up with PCP or arrange E-visit). The importance of social distancing was discussed today.  Time:   Today, I have spent 11 minutes with the patient with telehealth technology discussing the above problems.    Follow Up:  Virtual Visit  prn  Signed, Ivy Lynn, NP  11/13/2020 9:02 AM    [place]

## 2020-11-27 ENCOUNTER — Telehealth: Payer: Self-pay

## 2020-11-27 MED ORDER — PANTOPRAZOLE SODIUM 40 MG PO TBEC: 40.0000 mg | DELAYED_RELEASE_TABLET | Freq: Every day | ORAL | 5 refills | Status: DC

## 2020-11-27 NOTE — Telephone Encounter (Signed)
  Prescription Request  11/27/2020  What is the name of the medication or equipment? ALL  Have you contacted your pharmacy to request a refill? (if applicable) Yes  Which pharmacy would you like this sent to? [facility]  Pt called stating that he recently just saw Je and established care with [place]. Says refills were supposed to be called in for his medications but were not and now pt cant get refills. [facility]

## 2020-11-27 NOTE — Telephone Encounter (Signed)
Pt aware refill sent to pharmacy, he only needed the pantoprazole, others are managed by cardiology & another provider

## 2020-12-14 ENCOUNTER — Other Ambulatory Visit: Payer: Self-pay | Admitting: *Deleted

## 2020-12-14 MED ORDER — METOPROLOL SUCCINATE ER 25 MG PO TB24: 25.0000 mg | ORAL_TABLET | Freq: Every day | ORAL | 0 refills | Status: DC

## 2021-01-23 ENCOUNTER — Ambulatory Visit: Payer: BC Managed Care – PPO | Admitting: Nurse Practitioner

## 2021-02-05 NOTE — Progress Notes (Signed)
Cardiology Office Note   Date:  02/06/2021   ID:  Sean Rivas, DOB [birth year], MRN [id number]  PCP:  Ivy Lynn, NP  Cardiologist:   None Referring:  Ivy Lynn, NP  Chief Complaint  Patient presents with  . Coronary Artery Disease      History of Present Illness: Sean Rivas is a 50 y.o. male who presents for follow-up coronary artery disease.  He had a stent to an obtuse marginal in 2005.  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  There was a second stent in that vessel with disease after the first stent.  And nonobstructive disease elsewhere.  He had thrombectomy and DES placement to the OM 2.  It was recommended at that time he continue DAPT indefinitely.  Follow-up perfusion study in 2018 demonstrated no scar or ischemia with a well-preserved ejection fraction.    This is his first appointment with me.  Since he was last seen he has had no new symptoms.  He denies any chest pressure, neck or arm discomfort.  He has had no new shortness of breath, PND or orthopnea.  He exercises routinely and eluding doing cardiac exercises.  He has not had any of the symptoms was his previous angina.  He denies any palpitations, presyncope or syncope.  Has had no weight gain or edema.    Past Medical History:  Diagnosis Date  . Alcohol abuse   . Coronary artery disease    A.  07/18/2004 - PCI/DES OM2: 2.5x13 Cypher DES Dist/3.0x13 Cypher Prox;   B.  06/2009 & 06/2011- NL Stress Echo EF 70-75%;  C. 09/21/2011 s/p STEMI 2/2 late stent thrombosis -  thrombectomy and PCI/DES 3.0 x 28 mm Promus  . GERD (gastroesophageal reflux disease)   . Hyperlipidemia   . Hypertension   . MI, acute, non ST segment elevation ([place]) 2012  . Tobacco abuse    24 pack year - 1ppd as of 09/21/2011    Past Surgical History:  Procedure Laterality Date  . CORONARY STENT PLACEMENT    . LEFT HEART  CATHETERIZATION WITH CORONARY ANGIOGRAM N/A 09/21/2011   Procedure: LEFT HEART CATHETERIZATION WITH CORONARY ANGIOGRAM;  Surgeon: Wellington Hampshire, MD;  Location: [facility] CATH LAB;  Service: Cardiovascular;  Laterality: N/A;  . PERCUTANEOUS CORONARY STENT INTERVENTION (PCI-S)  09/21/2011   Procedure: PERCUTANEOUS CORONARY STENT INTERVENTION (PCI-S);  Surgeon: Wellington Hampshire, MD;  Location: [facility] CATH LAB;  Service: Cardiovascular;;     Current Outpatient Medications  Medication Sig Dispense Refill  . aspirin EC 81 MG tablet Take 1 tablet (81 mg total) by mouth daily. 30 tablet 0  . pantoprazole (PROTONIX) 40 MG tablet Take 1 tablet (40 mg total) by mouth daily. 30 tablet 5  . sildenafil (VIAGRA) 50 MG tablet Take by mouth as needed.    . TESTOSTERONE IM Inject into the muscle.    . Alirocumab (PRALUENT) 75 MG/ML SOAJ Inject 1 pen into the skin every 14 (fourteen) days. 1 mL 6  . allopurinol (ZYLOPRIM) 100 MG tablet Take 1 tablet (100 mg total) by mouth daily. (Patient not taking: No sig reported) 30 tablet 0  . isosorbide mononitrate (IMDUR) 30 MG 24 hr tablet Take 1 tablet (30 mg total) by mouth daily. 90 tablet 3  . lisinopril (ZESTRIL) 20 MG tablet Take 1 tablet (20 mg total) by mouth daily. 90 tablet  3  . metoprolol succinate (TOPROL-XL) 25 MG 24 hr tablet Take 1 tablet (25 mg total) by mouth daily. 90 tablet 3  . nitroGLYCERIN (NITROSTAT) 0.4 MG SL tablet PLACE 1 TABLET (0.4 MG TOTAL) UNDER THE TONGUE EVERY 5 (FIVE) MINUTES AS NEEDED. FOR CHEST PAIN 25 tablet 3  . prasugrel (EFFIENT) 10 MG TABS tablet Take 1 tablet (10 mg total) by mouth daily. 90 tablet 2  . rosuvastatin (CRESTOR) 20 MG tablet Take 1 tablet (20 mg total) by mouth daily. 90 tablet 3   No current facility-administered medications for this visit.    Allergies:   Patient has no known allergies.   ROS:  Please see the history of present illness.   Otherwise, review of systems are positive for none.   All other systems are  reviewed and negative.    PHYSICAL EXAM: VS:  BP (!) 150/110   Pulse 84   Ht 5\' 9"  (1.753 m)   Wt 233 lb (105.7 kg)   BMI 34.41 kg/m  , BMI Body mass index is 34.41 kg/m. GENERAL:  Well appearing HEENT:  Pupils equal round and reactive, fundi not visualized, oral mucosa unremarkable NECK:  No jugular venous distention, waveform within normal limits, carotid upstroke brisk and symmetric, no bruits, no thyromegaly LYMPHATICS:  No cervical, inguinal adenopathy LUNGS:  Clear to auscultation bilaterally BACK:  No CVA tenderness CHEST:  Unremarkable HEART:  PMI not displaced or sustained,S1 and S2 within normal limits, no S3, no S4, no clicks, no rubs, no murmurs ABD:  Flat, positive bowel sounds normal in frequency in pitch, no bruits, no rebound, no guarding, no midline pulsatile mass, no hepatomegaly, no splenomegaly EXT:  2 plus pulses throughout, no edema, no cyanosis no clubbing SKIN:  No rashes no nodules NEURO:  Cranial nerves II through XII grossly intact, motor grossly intact throughout PSYCH:  Cognitively intact, oriented to person place and time    EKG:  EKG is ordered today. The ekg ordered today demonstrates sinus rhythm, rate 84, axis within normal limits, intervals within normal limits, no acute ST-T wave changes.   Recent Labs: 07/09/2020: ALT 39    Lipid Panel    Component Value Date/Time   CHOL 110 07/09/2020 0806   TRIG 120 07/09/2020 0806   HDL 38 (L) 07/09/2020 0806   CHOLHDL 2.9 07/09/2020 0806   CHOLHDL 5.9 (H) 09/24/2017 0803   VLDL 42 (H) 01/21/2012 0856   LDLCALC 50 07/09/2020 0806   LDLCALC 124 (H) 09/24/2017 0803      Wt Readings from Last 3 Encounters:  02/06/21 233 lb (105.7 kg)  10/24/20 238 lb 6.4 oz (108.1 kg)  01/11/20 242 lb (109.8 kg)      Other studies Reviewed: Additional studies/ records that were reviewed today include: Labs. Review of the above records demonstrates:  Please see elsewhere in the note.     ASSESSMENT AND  PLAN:  CAD: We are pursuing aggressive risk reduction.  I am going to screen him with a POET (Plain Old Exercise Treadmill) next year.    DYSLIPIDEMIA: LDL is excellent on PCSK9 inhibitor.  In October his LDL was 50 with an HDL of 38.  No change in therapy.  HTN: Blood pressure is elevated he says this is completely unusual and he is going to keep a blood pressure diary.  He says it is typically in the 120s over 80s.  For now no change in medicines.   Current medicines are reviewed at length with the patient  today.  The patient does not have concerns regarding medicines.  The following changes have been made:  no change  Labs/ tests ordered today include:   Orders Placed This Encounter  Procedures  . EXERCISE TOLERANCE TEST (ETT)     Disposition:   FU with me in 1 year.   Signed, Minus Breeding, MD  02/06/2021 5:21 PM    [place]

## 2021-02-06 ENCOUNTER — Other Ambulatory Visit: Payer: Self-pay

## 2021-02-06 ENCOUNTER — Encounter: Payer: Self-pay | Admitting: Cardiology

## 2021-02-06 ENCOUNTER — Ambulatory Visit (INDEPENDENT_AMBULATORY_CARE_PROVIDER_SITE_OTHER): Payer: BC Managed Care – PPO | Admitting: Cardiology

## 2021-02-06 VITALS — BP 150/110 | HR 84 | Ht 69.0 in | Wt 233.0 lb

## 2021-02-06 DIAGNOSIS — I10 Essential (primary) hypertension: Secondary | ICD-10-CM | POA: Diagnosis not present

## 2021-02-06 DIAGNOSIS — E785 Hyperlipidemia, unspecified: Secondary | ICD-10-CM

## 2021-02-06 DIAGNOSIS — I25118 Atherosclerotic heart disease of native coronary artery with other forms of angina pectoris: Secondary | ICD-10-CM

## 2021-02-06 MED ORDER — LISINOPRIL 20 MG PO TABS: 20.0000 mg | ORAL_TABLET | Freq: Every day | ORAL | 3 refills | Status: DC

## 2021-02-06 MED ORDER — PRASUGREL HCL 10 MG PO TABS: 10.0000 mg | ORAL_TABLET | Freq: Every day | ORAL | 2 refills | Status: DC

## 2021-02-06 MED ORDER — METOPROLOL SUCCINATE ER 25 MG PO TB24: 25.0000 mg | ORAL_TABLET | Freq: Every day | ORAL | 3 refills | Status: DC

## 2021-02-06 MED ORDER — ROSUVASTATIN CALCIUM 20 MG PO TABS: 20.0000 mg | ORAL_TABLET | Freq: Every day | ORAL | 3 refills | Status: DC

## 2021-02-06 MED ORDER — NITROGLYCERIN 0.4 MG SL SUBL: SUBLINGUAL_TABLET | SUBLINGUAL | 3 refills | Status: DC

## 2021-02-06 MED ORDER — PRALUENT 75 MG/ML ~~LOC~~ SOAJ: 1.0000 "pen " | SUBCUTANEOUS | 6 refills | Status: DC

## 2021-02-06 MED ORDER — ISOSORBIDE MONONITRATE ER 30 MG PO TB24: 30.0000 mg | ORAL_TABLET | Freq: Every day | ORAL | 3 refills | Status: DC

## 2021-02-06 NOTE — Patient Instructions (Signed)
Medication Instructions:  The current medical regimen is effective;  continue present plan and medications.  *If you need a refill on your cardiac medications before your next appointment, please call your pharmacy*   Testing/Procedures: Your physician has requested that you have an exercise tolerance test. For further information please visit [email or web address]. This will be completed in 1 year in the [place] office.  You will be contacted to be scheduled for this appointment.  Follow-Up: At [facility], you and your health needs are our priority.  As part of our continuing mission to provide you with exceptional heart care, we have created designated Provider Care Teams.  These Care Teams include your primary Cardiologist (physician) and Advanced Practice Providers (APPs -  Physician Assistants and Nurse Practitioners) who all work together to provide you with the care you need, when you need it.  We recommend signing up for the patient portal called "MyChart".  Sign up information is provided on this After Visit Summary.  MyChart is used to connect with patients for Virtual Visits (Telemedicine).  Patients are able to view lab/test results, encounter notes, upcoming appointments, etc.  Non-urgent messages can be sent to your provider as well.   To learn more about what you can do with MyChart, go to [email or web address].    Your next appointment:   12 month(s)  The format for your next appointment:   In Person  Provider:   Minus Breeding, MD   Thank you for choosing [facility]!!

## 2021-02-07 NOTE — Addendum Note (Signed)
Addended by: Shellia Cleverly on: 02/07/2021 10:09 AM   Modules accepted: Orders

## 2021-03-19 ENCOUNTER — Ambulatory Visit (INDEPENDENT_AMBULATORY_CARE_PROVIDER_SITE_OTHER): Payer: BC Managed Care – PPO

## 2021-03-19 ENCOUNTER — Encounter: Payer: Self-pay | Admitting: Family

## 2021-03-19 ENCOUNTER — Ambulatory Visit (INDEPENDENT_AMBULATORY_CARE_PROVIDER_SITE_OTHER): Payer: BC Managed Care – PPO | Admitting: Family

## 2021-03-19 DIAGNOSIS — R0602 Shortness of breath: Secondary | ICD-10-CM | POA: Diagnosis not present

## 2021-03-19 DIAGNOSIS — U071 COVID-19: Secondary | ICD-10-CM

## 2021-03-19 MED ORDER — ALBUTEROL SULFATE HFA 108 (90 BASE) MCG/ACT IN AERS: 2.0000 | INHALATION_SPRAY | Freq: Four times a day (QID) | RESPIRATORY_TRACT | 0 refills | Status: DC | PRN

## 2021-03-19 MED ORDER — PREDNISONE 10 MG (21) PO TBPK: ORAL_TABLET | ORAL | 0 refills | Status: DC

## 2021-03-19 NOTE — Patient Instructions (Signed)
Symptoms of COVID-19 Watch for symptoms People with COVID-19 have had a wide range of symptoms reported - ranging from mild symptoms to severe illness. Symptoms may appear 2-14 days after exposure to the virus. Anyone can have mild to severe symptoms. People with these symptoms may have COVID-19: Fever or chills Cough Shortness of breath or difficulty breathing Fatigue Muscle or body aches Headache New loss of taste or smell Sore throat Congestion or runny nose Nausea or vomiting Diarrhea This list does not include all possible symptoms. CDC will continue to update this list as we learn more about COVID-19. Older adults and people who have severe underlying medical conditions like heart or lung disease or diabetes seem to be at higher risk for developing more serious complications [id number] illness. When to seek emergency medical attention Look for emergency warning signs* for COVID-19. If someone is showing any of these signs, seek emergency medical care immediately: Trouble breathing Persistent pain or pressure in the chest New confusion Inability to wake or stay awake Pale, gray, or blue-colored skin, lips, or nail beds, depending on skin tone *This list is not all possible symptoms. Please call your medical provider forany other symptoms that are severe or concerning to you.  Call 911 or call ahead to your local emergency facility: Notify the operatorthat you are seeking care for someone who has or may have COVID-19. If you are sick Check symptoms with a Coronavirus Self-Checker Get tested What to do if you are sick Isolate if you are sick When to quarantine How to care for someone who is sick Difference between COVID-19 & flu Influenza (Flu) and COVID-19 are both contagious respiratory illnesses, but they are caused by different viruses. COVID-19 is caused by infection with a new coronavirus (called SARS-CoV-2), and flu is caused by infection with influenza viruses.  COVID-19  seems to spread more easily than flu and causes more serious illnesses in some people. It can also take longer before people show symptoms and people can be contagious for longer. More information about differences between fluand COVID-19 is available in the different sections below.  Because some of the symptoms of flu and COVID-19 are similar, it may be hard to tell the difference between them based on symptoms alone, and testingmay be needed to help confirm a diagnosis. While more is learned every day about COVID-19 and the virus that causes it, there is still a lot that is unknown . This page compares COVID-19 and flu,given the best available information to date. Centers for Disease Control and Prevention Content source: [employer]piratory [employer] (NCIRD), Division of Viral Diseases 11/21/2019 This information is not intended to replace advice given to you by your health care provider. Make sure you discuss any questions you have with your healthcare provider. Document Revised: 11/02/2020 Document Reviewed: 11/02/2020 Elsevier Patient Education  [place].

## 2021-03-19 NOTE — Progress Notes (Signed)
Virtual Visit  Note Due to COVID-19 pandemic this visit was conducted virtually. This visit type was conducted due to national recommendations for restrictions regarding the COVID-19 Pandemic (e.g. social distancing, sheltering in place) in an effort to limit this patient's exposure and mitigate transmission in our community. All issues noted in this document were discussed and addressed.  A physical exam was not performed with this format.  I connected with Sean Rivas on 03/19/21 at 8:42 AM  by telephone and verified that I am speaking with the correct person using two identifiers. Sean Rivas is currently located at home and no one is currently with him during visit. The provider, Evelina Dun, FNP is located in their office at time of visit.  I discussed the limitations, risks, security and privacy concerns of performing an evaluation and management service by telephone and the availability of in person appointments. I also discussed with the patient that there may be a patient responsible charge related to this service. The patient expressed understanding and agreed to proceed.   History and Present Illness:  Pt presents to the office today with positive COVID. He reports he tested positive for COVID on 03/12/21. He reports congestion, headache, and SOB. Denies any cough, fevers. He reports he is feeling better, but states the SOB has worsen.  Shortness of Breath This is a new problem. The current episode started in the past 7 days. The problem occurs intermittently. Associated symptoms include headaches and rhinorrhea. Pertinent negatives include no fever. The symptoms are aggravated by any activity. The treatment provided mild relief.    Review of Systems  Constitutional:  Negative for fever.  HENT:  Positive for rhinorrhea.   Respiratory:  Positive for shortness of breath.   Neurological:  Positive for headaches.    Observations/Objective: No SOB or distress noted,  slight nasal congestion noted  Assessment and Plan: 1. COVID-19 virus detected COVID positive, rest, force fluids, tylenol as needed, report any worsening symptoms such as increased shortness of breath, swelling, or continued high fevers.  Will do chest x-ray to rule out pneumonia.  Albuterol as needed If SOB worsens or does not improve over the next few days needs to be seen in person - predniSONE (STERAPRED UNI-PAK 21 TAB) 10 MG (21) TBPK tablet; Use as directed  Dispense: 21 tablet; Refill: 0 - albuterol (VENTOLIN HFA) 108 (90 Base) MCG/ACT inhaler; Inhale 2 puffs into the lungs every 6 (six) hours as needed for wheezing or shortness of breath.  Dispense: 8 g; Refill: 0 - DG Chest 2 View; Future  2. SOB (shortness of breath) - DG Chest 2 View; Future     I discussed the assessment and treatment plan with the patient. The patient was provided an opportunity to ask questions and all were answered. The patient agreed with the plan and demonstrated an understanding of the instructions.   The patient was advised to call back or seek an in-person evaluation if the symptoms worsen or if the condition fails to improve as anticipated.  The above assessment and management plan was discussed with the patient. The patient verbalized understanding of and has agreed to the management plan. Patient is aware to call the clinic if symptoms persist or worsen. Patient is aware when to return to the clinic for a follow-up visit. Patient educated on when it is appropriate to go to the emergency department.   Time call ended:  9:03 AM   I provided 21 minutes of  non face-to-face  time during this encounter.    Evelina Dun, FNP

## 2021-04-10 ENCOUNTER — Other Ambulatory Visit: Payer: Self-pay | Admitting: Family

## 2021-04-10 DIAGNOSIS — U071 COVID-19: Secondary | ICD-10-CM

## 2021-05-06 ENCOUNTER — Other Ambulatory Visit: Payer: Self-pay | Admitting: *Deleted

## 2021-05-06 MED ORDER — PANTOPRAZOLE SODIUM 40 MG PO TBEC: 40.0000 mg | DELAYED_RELEASE_TABLET | Freq: Every day | ORAL | 0 refills | Status: DC

## 2021-07-11 ENCOUNTER — Other Ambulatory Visit: Payer: Self-pay | Admitting: Nurse Practitioner

## 2021-07-11 NOTE — Telephone Encounter (Signed)
Je NTBS 30 days given 06/16/21

## 2021-08-02 ENCOUNTER — Other Ambulatory Visit: Payer: Self-pay | Admitting: Cardiology

## 2021-10-09 ENCOUNTER — Encounter: Payer: Self-pay | Admitting: Nurse Practitioner

## 2021-10-09 ENCOUNTER — Ambulatory Visit: Payer: BC Managed Care – PPO | Admitting: Nurse Practitioner

## 2021-10-09 VITALS — BP 135/82 | HR 83 | Temp 97.4°F | Resp 20 | Ht 69.0 in | Wt 239.0 lb

## 2021-10-09 DIAGNOSIS — J069 Acute upper respiratory infection, unspecified: Secondary | ICD-10-CM

## 2021-10-09 MED ORDER — PSEUDOEPH-BROMPHEN-DM 30-2-10 MG/5ML PO SYRP: 5.0000 mL | ORAL_SOLUTION | Freq: Four times a day (QID) | ORAL | 0 refills | Status: DC | PRN

## 2021-10-09 NOTE — Progress Notes (Signed)
Id   Acute Office Visit  Subjective:    Patient ID: Sean Rivas, male    DOB: December 09, 1970, 51 y.o.   MRN: [id number]  Chief Complaint  Patient presents with   Flu like symptoms    URI  This is a new problem. The current episode started yesterday. The problem has been unchanged. The maximum temperature recorded prior to his arrival was 100.4 - 100.9 F. Associated symptoms include congestion, coughing, headaches and nausea. Pertinent negatives include no rash. He has tried NSAIDs for the symptoms. The treatment provided mild relief.  Cough This is a new problem. The current episode started yesterday. The problem has been unchanged. The problem occurs constantly. The cough is Non-productive. Associated symptoms include chills, headaches and nasal congestion. Pertinent negatives include no rash. Nothing aggravates the symptoms. He has tried nothing for the symptoms.     Past Medical History:  Diagnosis Date   Alcohol abuse    Coronary artery disease    A.  07/18/2004 - PCI/DES OM2: 2.5x13 Cypher DES Dist/3.0x13 Cypher Prox;   B.  06/2009 & 06/2011- NL Stress Echo EF 70-75%;  C. 09/21/2011 s/p STEMI 2/2 late stent thrombosis -  thrombectomy and PCI/DES 3.0 x 28 mm Promus   GERD (gastroesophageal reflux disease)    Hyperlipidemia    Hypertension    MI, acute, non ST segment elevation ([place]) 2012   Tobacco abuse    24 pack year - 1ppd as of 09/21/2011    Past Surgical History:  Procedure Laterality Date   CORONARY STENT PLACEMENT     LEFT HEART CATHETERIZATION WITH CORONARY ANGIOGRAM N/A 09/21/2011   Procedure: LEFT HEART CATHETERIZATION WITH CORONARY ANGIOGRAM;  Surgeon: Wellington Hampshire, MD;  Location: [place] CATH LAB;  Service: Cardiovascular;  Laterality: N/A;   PERCUTANEOUS CORONARY STENT INTERVENTION (PCI-S)  09/21/2011   Procedure: PERCUTANEOUS CORONARY STENT INTERVENTION (PCI-S);  Surgeon: Wellington Hampshire, MD;  Location: [facility] CATH LAB;  Service: Cardiovascular;;    Family  History  Problem Relation Age of Onset   Coronary artery disease Mother 46       deceased @ 51   Alcohol abuse Father    Coronary artery disease Brother 25       s/p stenting   Alcohol abuse Brother    Heart failure Brother 48       s/p ICD   Alcohol abuse Paternal Aunt    Alcohol abuse Paternal Uncle    Alcohol abuse Paternal Grandfather     Social History   Socioeconomic History   Marital status: Married    Spouse name: Not on file   Number of children: Not on file   Years of education: Not on file   Highest education level: Not on file  Occupational History   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  Exercises most days of the week - runs/lift weights.   Social Determinants of Health   Financial Resource Strain: Not on file  Food Insecurity: Not on file  Transportation Needs: Not on file  Physical Activity: Not on file  Stress: Not on file  Social Connections: Not on file  Intimate Partner Violence: Not on file    Outpatient Medications Prior to Visit  Medication Sig Dispense Refill   Alirocumab (PRALUENT) 75 MG/ML SOAJ Inject 1 pen into the skin every 14 (fourteen) days. 75 mL 7   aspirin EC 81 MG tablet Take 1 tablet (81 mg total) by mouth daily. 30 tablet 0   isosorbide mononitrate (IMDUR) 30 MG 24 hr tablet Take 1 tablet (30 mg total) by mouth daily. 90 tablet 3   lisinopril (ZESTRIL) 20 MG tablet Take 1 tablet (20 mg total)  by mouth daily. 90 tablet 3   metoprolol succinate (TOPROL-XL) 25 MG 24 hr tablet Take 1 tablet (25 mg total) by mouth daily. 90 tablet 3   nitroGLYCERIN (NITROSTAT) 0.4 MG SL tablet PLACE 1 TABLET (0.4 MG TOTAL) UNDER THE TONGUE EVERY 5 (FIVE) MINUTES AS NEEDED. FOR CHEST PAIN 25 tablet 3   pantoprazole (PROTONIX) 40 MG tablet Take 1 tablet (40 mg total) by mouth daily. (NEEDS TO BE SEEN BEFORE NEXT REFILL) 30 tablet 0   prasugrel (EFFIENT) 10 MG TABS tablet Take 1 tablet (10 mg total) by mouth daily. 90 tablet 2   rosuvastatin (CRESTOR) 20 MG tablet Take 1 tablet (20 mg total) by mouth daily. 90 tablet 3   sildenafil (VIAGRA) 50 MG tablet Take by mouth as needed.     TESTOSTERONE IM Inject into the muscle.     albuterol (VENTOLIN HFA) 108 (90 Base) MCG/ACT inhaler TAKE 2 PUFFS BY MOUTH EVERY 6 HOURS AS NEEDED FOR WHEEZE OR SHORTNESS OF BREATH (Patient not taking: Reported on 10/09/2021) 8.5 each 0   allopurinol (ZYLOPRIM) 100 MG tablet Take 1 tablet (100 mg total) by mouth daily. (Patient not taking: Reported on 10/24/2020) 30 tablet 0   predniSONE (STERAPRED UNI-PAK 21 TAB) 10 MG (21) TBPK tablet Use as directed 21 tablet 0   No facility-administered medications prior to visit.    No Known Allergies  Review of Systems  Constitutional:  Positive for chills.  HENT:  Positive for congestion.   Eyes: Negative.   Respiratory:  Positive for cough.   Gastrointestinal:  Positive for nausea.  Skin: Negative.  Negative for rash.  Neurological:  Positive for headaches.  All other systems reviewed and are negative.     Objective:    Physical Exam Vitals and nursing note reviewed.  Constitutional:      Appearance: Normal appearance.  HENT:     Head: Normocephalic.     Right Ear: External ear normal.     Left Ear: External ear normal.     Nose: Congestion present.     Mouth/Throat:     Mouth: Mucous membranes are moist.     Pharynx: Oropharynx is clear.  Eyes:     Conjunctiva/sclera:  Conjunctivae normal.  Cardiovascular:     Rate and Rhythm: Normal rate and regular rhythm.     Pulses: Normal pulses.     Heart sounds: Normal heart sounds.  Pulmonary:     Effort: Pulmonary effort is normal.     Breath sounds: Normal breath sounds.  Abdominal:     General: Bowel sounds are normal.  Musculoskeletal:        General: Normal range of motion.  Skin:    General: Skin is warm.     Findings: No rash.  Neurological:     Mental Status: He is alert.    BP 135/82    Pulse 83    Temp (!) 97.4 F (36.3 C) (Oral)    Resp 20    Ht 5\' 9"  (1.753 m)    Wt 239 lb (108.4 kg)    SpO2 98%    BMI 35.29 kg/m  Wt Readings from Last 3 Encounters:  10/09/21 239 lb (108.4 kg)  02/06/21 233 lb (105.7 kg)  10/24/20 238 lb 6.4 oz (108.1 kg)    Health Maintenance Due  Topic Date Due   COVID-19 Vaccine (1) Never done   HIV Screening  Never done   Hepatitis C Screening  Never done   TETANUS/TDAP  Never done   Pneumococcal Vaccine 30-13 Years old (2 - PCV) 08/08/2014   COLONOSCOPY (Pts 45-60yrs Insurance coverage will need to be confirmed)  Never done    There are no preventive care reminders to display for this patient.   Lab Results  Component Value Date   TSH 7.45 (H) 09/24/2017   Lab Results  Component Value Date   WBC 11.7 (H) 05/14/2018   HGB 15.0 05/14/2018   HCT 45.0 05/14/2018   MCV 89.6 05/14/2018   PLT 292 05/14/2018   Lab Results  Component Value Date   NA 134 (L) 05/14/2018   K 4.1 05/14/2018   CO2 24 05/14/2018   GLUCOSE 102 (H) 05/14/2018   BUN 18 05/14/2018   CREATININE 1.11 05/14/2018   BILITOT 0.5 07/09/2020   ALKPHOS 48 07/09/2020   AST 35 07/09/2020   ALT 39 07/09/2020   PROT 7.0 07/09/2020   ALBUMIN 4.8 07/09/2020   CALCIUM 9.1 05/14/2018   ANIONGAP 9 05/14/2018   Lab Results  Component Value Date   CHOL 110 07/09/2020   Lab Results  Component Value Date   HDL 38 (L) 07/09/2020   Lab Results  Component Value Date   LDLCALC 50  07/09/2020   Lab Results  Component Value Date   TRIG 120 07/09/2020   Lab Results  Component Value Date   CHOLHDL 2.9 07/09/2020   Lab Results  Component Value Date   HGBA1C 5.6 09/21/2011       Assessment & Plan:  Take meds as prescribed - Use a cool mist humidifier  -Use saline nose sprays frequently -Force fluids -For fever or aches or pains- take Tylenol or ibuprofen. -at home COVID-19 swab negative -flu swab completed results pending -If symptoms do not improve, she may need to be COVID tested to rule this out Follow up with worsening unresolved symptoms  Problem List Items Addressed This Visit       Respiratory   Upper respiratory tract infection - Primary   Relevant Medications   brompheniramine-pseudoephedrine-DM 30-2-10 MG/5ML syrup   Other Relevant Orders   Veritor Flu A/B Waived   Novel Coronavirus, NAA ([facility])     Meds ordered this encounter  Medications   brompheniramine-pseudoephedrine-DM 30-2-10 MG/5ML syrup    Sig: Take 5 mLs by mouth 4 (four) times daily as needed.    Dispense:  120 mL    Refill:  0    Order Specific Question:   Supervising Provider    Answer:   Claretta Fraise [[id number]]     Ivy Lynn, NP

## 2021-10-09 NOTE — Patient Instructions (Signed)

## 2021-10-10 LAB — SARS-COV-2, NAA 2 DAY TAT

## 2021-10-10 LAB — VERITOR FLU A/B WAIVED
Influenza A: NEGATIVE
Influenza B: NEGATIVE

## 2021-10-10 LAB — NOVEL CORONAVIRUS, NAA: SARS-CoV-2, NAA: NOT DETECTED

## 2021-11-03 ENCOUNTER — Other Ambulatory Visit: Payer: Self-pay | Admitting: Cardiology

## 2022-02-04 ENCOUNTER — Other Ambulatory Visit: Payer: Self-pay | Admitting: Cardiology

## 2022-05-02 ENCOUNTER — Telehealth: Payer: Self-pay | Admitting: Cardiology

## 2022-05-02 NOTE — Telephone Encounter (Signed)
Pt c/o medication issue:  1. Name of Medication:   Alirocumab ([place]) 75 MG/ML SOAJ  2. How are you currently taking this medication (dosage and times per day)?   As prescribed  3. Are you having a reaction (difficulty breathing--STAT)?   4. What is your medication issue?   Patient stated he is out of this medication and his insurance is no longer covering it.  Patient states his insurance will need a note stating that he needs this medication.

## 2022-05-06 MED ORDER — REPATHA 140 MG/ML ~~LOC~~ SOSY: 140.0000 mg | PREFILLED_SYRINGE | SUBCUTANEOUS | 11 refills | Status: DC

## 2022-05-06 NOTE — Telephone Encounter (Signed)
Insurance switched to [place] as of July 1.  PA approved to 12/17/22  Key: Sean Rivas

## 2022-05-20 ENCOUNTER — Other Ambulatory Visit: Payer: Self-pay | Admitting: Cardiology

## 2022-08-16 ENCOUNTER — Other Ambulatory Visit: Payer: Self-pay | Admitting: Cardiology

## 2022-08-18 ENCOUNTER — Telehealth: Payer: Self-pay | Admitting: Cardiology

## 2022-08-18 DIAGNOSIS — Z79899 Other long term (current) drug therapy: Secondary | ICD-10-CM

## 2022-08-18 DIAGNOSIS — E785 Hyperlipidemia, unspecified: Secondary | ICD-10-CM

## 2022-08-18 MED ORDER — ROSUVASTATIN CALCIUM 20 MG PO TABS: 20.0000 mg | ORAL_TABLET | Freq: Every day | ORAL | 0 refills | Status: DC

## 2022-08-18 MED ORDER — LISINOPRIL 20 MG PO TABS: 20.0000 mg | ORAL_TABLET | Freq: Every day | ORAL | 0 refills | Status: DC

## 2022-08-18 MED ORDER — ISOSORBIDE MONONITRATE ER 30 MG PO TB24: 30.0000 mg | ORAL_TABLET | Freq: Every day | ORAL | 0 refills | Status: DC

## 2022-08-18 MED ORDER — METOPROLOL SUCCINATE ER 25 MG PO TB24: 25.0000 mg | ORAL_TABLET | Freq: Every day | ORAL | 0 refills | Status: DC

## 2022-08-18 NOTE — Telephone Encounter (Signed)
Spoke with patient who wanted to get lab work before his 12/20 appointment with Dr. Percival Spanish. Pleas advise on lab work.

## 2022-08-18 NOTE — Telephone Encounter (Signed)
Pt would like to know if he can come in to get labs done before his next appt with Dr. Percival Spanish on 12/20.

## 2022-08-18 NOTE — Telephone Encounter (Signed)
*  STAT* If patient is at the pharmacy, call can be transferred to refill team.   1. Which medications need to be refilled? (please list name of each medication and dose if known) lisinopril (ZESTRIL) 20 MG tablet  metoprolol succinate (TOPROL-XL) 25 MG 24 hr tablet   rosuvastatin (CRESTOR) 20 MG tablet  isosorbide mononitrate (IMDUR) 30 MG 24 hr tablet   2. Which pharmacy/location (including street and city if local pharmacy) is medication to be sent to?  [id number]- [place], [place] - [address]   3. Do they need a 30 day or 90 day supply? [address]

## 2022-08-19 ENCOUNTER — Other Ambulatory Visit: Payer: Self-pay

## 2022-08-19 DIAGNOSIS — Z79899 Other long term (current) drug therapy: Secondary | ICD-10-CM

## 2022-08-19 DIAGNOSIS — E785 Hyperlipidemia, unspecified: Secondary | ICD-10-CM

## 2022-08-19 NOTE — Telephone Encounter (Signed)
Spoke with patinet to inform him of order for fasting lipid profile. Order placed and mailed to patient's home.

## 2022-09-12 ENCOUNTER — Other Ambulatory Visit: Payer: Self-pay | Admitting: Cardiology

## 2022-09-13 LAB — LIPID PANEL
Chol/HDL Ratio: 2.3 ratio (ref 0.0–5.0)
Cholesterol, Total: 70 mg/dL — ABNORMAL LOW (ref 100–199)
HDL: 31 mg/dL — ABNORMAL LOW (ref >39)
LDL Chol Calc (NIH): 19 mg/dL (ref 0–99)
Triglycerides: 106 mg/dL (ref 0–149)
VLDL Cholesterol Cal: 20 mg/dL (ref 5–40)

## 2022-09-16 NOTE — Progress Notes (Signed)
Cardiology Office Note   Date:  09/17/2022   ID:  Sean Rivas, DOB 09/29/1971, MRN [id number]  PCP:  Daryll Drown, NP  Cardiologist:   None Referring:  Daryll Drown, NP  Chief Complaint  Patient presents with   Coronary Artery Disease      History of Present Illness: Sean Rivas is a 51 y.o. male who presents for follow-up coronary artery disease.  He had a stent to an obtuse marginal in 2005.  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  There was a second stent in that vessel with disease after the first stent.  And nonobstructive disease elsewhere.  He had thrombectomy and DES placement to the OM 2.  It was recommended at that time he continue DAPT indefinitely.  Follow-up perfusion study in 2018 demonstrated no scar or ischemia with a well-preserved ejection fraction.    This is his first appointment with me.  Since he was last seen he has not had any of the symptoms that were reviewed his previous angina.  He does have a lot of reflux.  He gets heartburn when he lays down at night.  This is exacerbated by certain foods.  He is not having any new shortness of breath, PND or orthopnea.  Not had any new palpitations, presyncope or syncope.  He spends a lot of time in the gym.  He is not doing as much aerobic activity.  He does a lot of lifting weights and has had no cardiovascular symptoms.   Past Medical History:  Diagnosis Date   Alcohol abuse    Coronary artery disease    A.  07/18/2004 - PCI/DES OM2: 2.5x13 Cypher DES Dist/3.0x13 Cypher Prox;   B.  06/2009 & 06/2011- NL Stress Echo EF 70-75%;  C. 09/21/2011 s/p STEMI 2/2 late stent thrombosis -  thrombectomy and PCI/DES 3.0 x 28 mm Promus   GERD (gastroesophageal reflux disease)    Hyperlipidemia    Hypertension    MI, acute, non ST segment elevation (HCC) 2012   Tobacco abuse    24 pack year - 1ppd as of 09/21/2011     Past Surgical History:  Procedure Laterality Date   CORONARY STENT PLACEMENT     LEFT HEART CATHETERIZATION WITH CORONARY ANGIOGRAM N/A 09/21/2011   Procedure: LEFT HEART CATHETERIZATION WITH CORONARY ANGIOGRAM;  Surgeon: Iran Ouch, MD;  Location: [facility] CATH [facility];  Service: Cardiovascular;  Laterality: N/A;   PERCUTANEOUS CORONARY STENT INTERVENTION (PCI-S)  09/21/2011   Procedure: PERCUTANEOUS CORONARY STENT INTERVENTION (PCI-S);  Surgeon: Iran Ouch, MD;  Location: [facility] CATH LAB;  Service: Cardiovascular;;     Current Outpatient Medications  Medication Sig Dispense Refill   aspirin EC 81 MG tablet Take 1 tablet (81 mg total) by mouth daily. 30 tablet 0   B-D 3CC LUER-LOK SYR 23GX1" 23G X 1" 3 ML MISC Use as directed with testosterone     isosorbide mononitrate (IMDUR) 30 MG 24 hr tablet Take 1 tablet (30 mg total) by mouth daily. 30 tablet 0   lisinopril (ZESTRIL) 20 MG tablet Take 1 tablet (20 mg total) by mouth daily. 30 tablet 0   nitroGLYCERIN (NITROSTAT) 0.4 MG SL tablet PLACE 1 TABLET (0.4 MG TOTAL) UNDER THE TONGUE EVERY 5 (FIVE) MINUTES AS NEEDED. FOR CHEST PAIN 25 tablet 3   omeprazole (PRILOSEC) 40 MG capsule Take 40 mg by  mouth daily.     pantoprazole (PROTONIX) 40 MG tablet Take 1 tablet (40 mg total) by mouth daily. (NEEDS TO BE SEEN BEFORE NEXT REFILL) 30 tablet 0   prasugrel (EFFIENT) 10 MG TABS tablet TAKE 1 TABLET BY MOUTH EVERY DAY 90 tablet 0   rosuvastatin (CRESTOR) 20 MG tablet TAKE 1 TABLET BY MOUTH EVERY DAY 90 tablet 3   TESTOSTERONE IM Inject into the muscle.     valACYclovir (VALTREX) 1000 MG tablet Take by mouth. Take 1 tab PO daily     albuterol (VENTOLIN HFA) 108 (90 Base) MCG/ACT inhaler TAKE 2 PUFFS BY MOUTH EVERY 6 HOURS AS NEEDED FOR WHEEZE OR SHORTNESS OF BREATH (Patient not taking: Reported on 10/09/2021) 8.5 each 0   allopurinol (ZYLOPRIM) 100 MG tablet Take 1 tablet (100 mg total) by mouth daily. (Patient not taking: Reported on 10/24/2020) 30  tablet 0   brompheniramine-pseudoephedrine-DM 30-2-10 MG/5ML syrup Take 5 mLs by mouth 4 (four) times daily as needed. (Patient not taking: Reported on 09/17/2022) 120 mL 0   Evolocumab (REPATHA) 140 MG/ML SOSY Inject 140 mg into the skin every 14 (fourteen) days. (Patient not taking: Reported on 09/17/2022) 2 mL 11   metoprolol succinate (TOPROL-XL) 25 MG 24 hr tablet TAKE 1 TABLET (25 MG TOTAL) BY MOUTH DAILY. (Patient not taking: Reported on 09/17/2022) 90 tablet 3   prednisoLONE acetate (PRED FORTE) 1 % ophthalmic suspension Place 1 drop into both eyes 4 (four) times daily. (Patient not taking: Reported on 09/17/2022)     sildenafil (VIAGRA) 50 MG tablet Take by mouth as needed. (Patient not taking: Reported on 09/17/2022)     No current facility-administered medications for this visit.    Allergies:   Patient has no known allergies.   ROS:  Please see the history of present illness.   Otherwise, review of systems are positive for none.   All other systems are reviewed and negative.    PHYSICAL EXAM: VS:  BP (!) 160/101   Pulse 77   Ht 5\' 9"  (1.753 m)   Wt 239 lb 6.4 oz (108.6 kg)   SpO2 95%   BMI 35.35 kg/m  , BMI Body mass index is 35.35 kg/m. GENERAL:  Well appearing NECK:  No jugular venous distention, waveform within normal limits, carotid upstroke brisk and symmetric, no bruits, no thyromegaly LUNGS:  Clear to auscultation bilaterally CHEST:  Unremarkable HEART:  PMI not displaced or sustained,S1 and S2 within normal limits, no S3, no S4, no clicks, no rubs, no murmurs ABD:  Flat, positive bowel sounds normal in frequency in pitch, no bruits, no rebound, no guarding, no midline pulsatile mass, no hepatomegaly, no splenomegaly EXT:  2 plus pulses throughout, no edema, no cyanosis no clubbing     EKG:  EKG is  ordered today. The ekg ordered today demonstrates sinus rhythm, rate 77, axis within normal limits, intervals within normal limits, no acute ST-T wave  changes.   Recent Labs: No results found for requested labs within last 365 days.    Lipid Panel    Component Value Date/Time   CHOL 70 (L) 09/12/2022 0756   TRIG 106 09/12/2022 0756   HDL 31 (L) 09/12/2022 0756   CHOLHDL 2.3 09/12/2022 0756   CHOLHDL 5.9 (H) 09/24/2017 0803   VLDL 42 (H) 01/21/2012 0856   LDLCALC 19 09/12/2022 0756   LDLCALC 124 (H) 09/24/2017 0803      Wt Readings from Last 3 Encounters:  09/17/22 239 lb 6.4 oz (108.6  kg)  10/09/21 239 lb (108.4 kg)  02/06/21 233 lb (105.7 kg)      Other studies Reviewed: Additional studies/ records that were reviewed today include: Labs. Review of the above records demonstrates:  Please see elsewhere in the note.     ASSESSMENT AND PLAN:  CAD:   The patient has no new sypmtoms.  No further cardiovascular testing is indicated.  We will continue with aggressive risk reduction and meds as listed.  DYSLIPIDEMIA: LDL is 19.  Checking with our pharmacist to make sure we can reduce his dose of Crestor without affecting his insurance coverage.  I do not think he needs to be on this higher dose.   HTN: Blood pressure is elevated today but he says he forgot to take his medications he thinks.  He says it is always well-controlled at home.  He will keep an eye on this but if his blood pressure remains elevated he might need to be on 40 mg of lisinopril.   REFLUX: He requests referral to gastroenterologist.  He is also probably due for colonoscopy.  Current medicines are reviewed at length with the patient today.  The patient does not have concerns regarding medicines.  The following changes have been made: None  Labs/ tests ordered today include: None  Orders Placed This Encounter  Procedures   Ambulatory referral to Gastroenterology   EKG 12-Lead     Disposition:   FU with me in 1 year.   Signed, Rollene Rotunda, MD  09/17/2022 5:09 PM    [facility]

## 2022-09-17 ENCOUNTER — Encounter: Payer: Self-pay | Admitting: Cardiology

## 2022-09-17 ENCOUNTER — Ambulatory Visit: Payer: BC Managed Care – PPO | Admitting: Cardiology

## 2022-09-17 VITALS — BP 160/101 | HR 77 | Ht 69.0 in | Wt 239.4 lb

## 2022-09-17 DIAGNOSIS — I25118 Atherosclerotic heart disease of native coronary artery with other forms of angina pectoris: Secondary | ICD-10-CM

## 2022-09-17 DIAGNOSIS — E785 Hyperlipidemia, unspecified: Secondary | ICD-10-CM | POA: Diagnosis not present

## 2022-09-17 DIAGNOSIS — K2100 Gastro-esophageal reflux disease with esophagitis, without bleeding: Secondary | ICD-10-CM

## 2022-09-17 DIAGNOSIS — I10 Essential (primary) hypertension: Secondary | ICD-10-CM

## 2022-09-17 NOTE — Patient Instructions (Signed)
Medication Instructions:  The current medical regimen is effective;  continue present plan and medications.  *If you need a refill on your cardiac medications before your next appointment, please call your pharmacy*  You have been referred to Gastroenterology for the evaluation of reflux.  Follow-Up: At [facility], you and your health needs are our priority.  As part of our continuing mission to provide you with exceptional heart care, we have created designated Provider Care Teams.  These Care Teams include your primary Cardiologist (physician) and Advanced Practice Providers (APPs -  Physician Assistants and Nurse Practitioners) who all work together to provide you with the care you need, when you need it.  We recommend signing up for the patient portal called "MyChart".  Sign up information is provided on this After Visit Summary.  MyChart is used to connect with patients for Virtual Visits (Telemedicine).  Patients are able to view lab/test results, encounter notes, upcoming appointments, etc.  Non-urgent messages can be sent to your provider as well.   To learn more about what you can do with MyChart, go to [email or web address].    Your next appointment:   1 year(s)  The format for your next appointment:   In Person  Provider:   Minus Breeding, MD     Important Information About Sugar

## 2022-09-18 ENCOUNTER — Encounter: Payer: Self-pay | Admitting: Gastroenterology

## 2022-09-19 ENCOUNTER — Telehealth: Payer: Self-pay | Admitting: Pharmacist Clinician (PhC)/ Clinical Pharmacy Specialist

## 2022-09-19 NOTE — Telephone Encounter (Signed)
-----   Message from Minus Breeding, MD sent at 09/17/2022  5:05 PM EST ----- Hey can you contact him please.  He is on 20 mg of Crestor and he said because the insurance required this.  I think he could be on a much lower dose given his results on PCSK9.  I would only keep him on the 20 if insurance demanded it.  Thanks

## 2022-09-20 ENCOUNTER — Other Ambulatory Visit: Payer: Self-pay | Admitting: Cardiology

## 2022-09-29 IMAGING — DX DG CHEST 2V
2 series · 2 of 2 positions shown · non-contrast
Comparison: 05/14/2018

CLINICAL DATA: Dyspnea, COVID positive

EXAM:
CHEST - 2 VIEW

[chest pa]
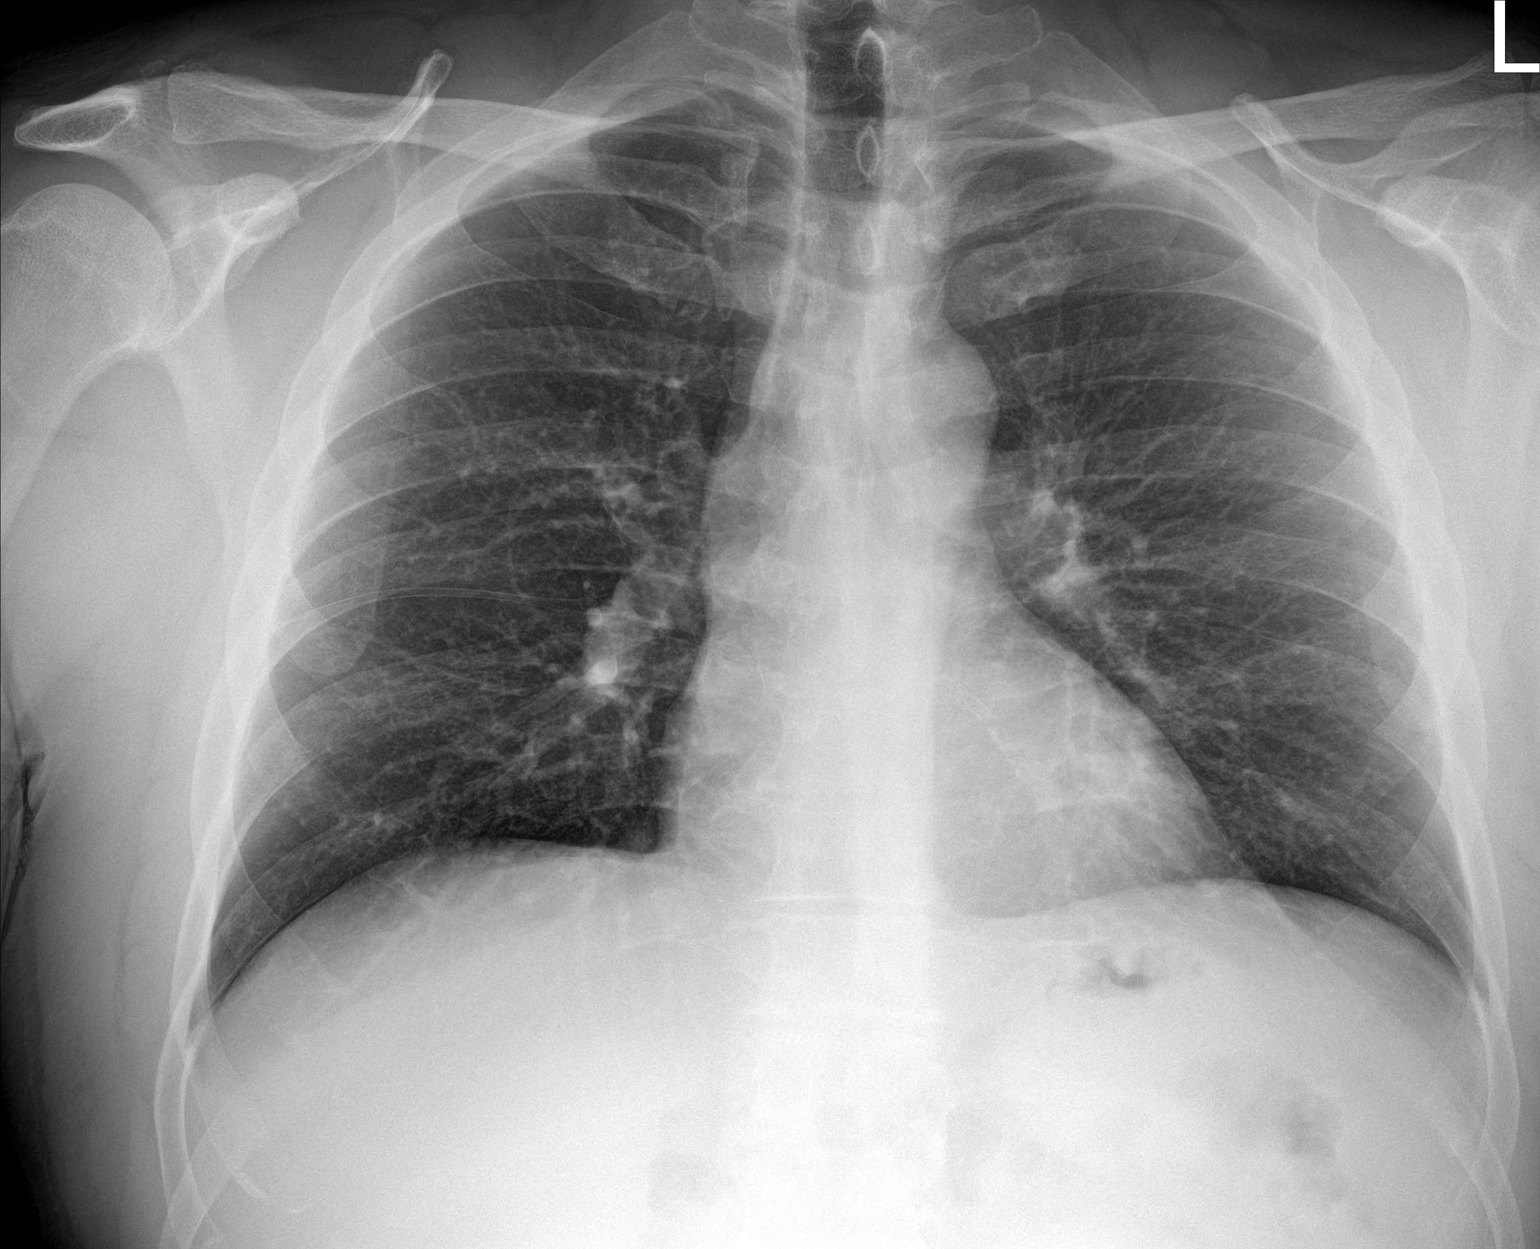

[chest lat]
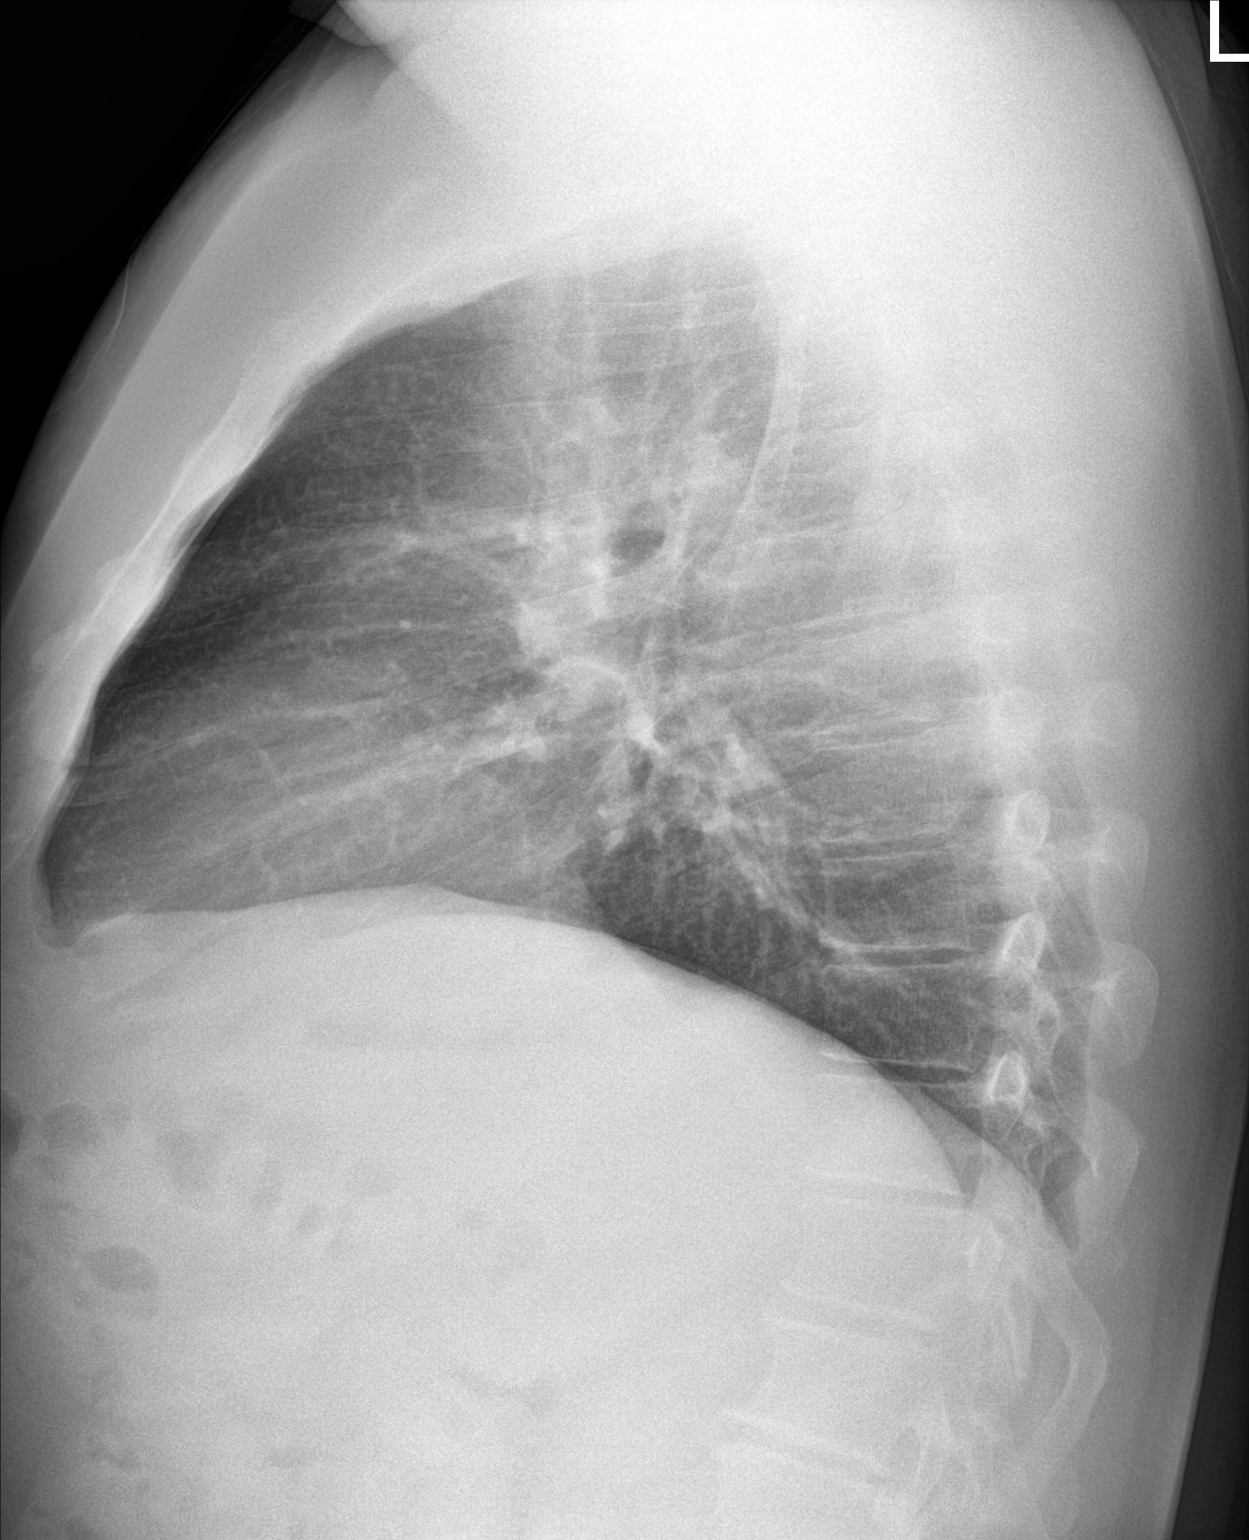

[2 of 2 positions shown; findings below may reference images not displayed]

FINDINGS: The heart size and mediastinal contours are within normal limits.
Both lungs are clear. The visualized skeletal structures are
unremarkable.
IMPRESSION: No active cardiopulmonary disease.

## 2022-10-29 ENCOUNTER — Encounter: Payer: Self-pay | Admitting: *Deleted

## 2022-10-29 ENCOUNTER — Encounter: Payer: Self-pay | Admitting: Gastroenterology

## 2022-10-29 ENCOUNTER — Ambulatory Visit (INDEPENDENT_AMBULATORY_CARE_PROVIDER_SITE_OTHER): Payer: BC Managed Care – PPO | Admitting: Gastroenterology

## 2022-10-29 ENCOUNTER — Encounter (INDEPENDENT_AMBULATORY_CARE_PROVIDER_SITE_OTHER): Payer: Self-pay | Admitting: *Deleted

## 2022-10-29 VITALS — BP 143/89 | HR 69 | Temp 98.4°F | Ht 69.0 in | Wt 247.0 lb

## 2022-10-29 DIAGNOSIS — K219 Gastro-esophageal reflux disease without esophagitis: Secondary | ICD-10-CM

## 2022-10-29 DIAGNOSIS — Z1211 Encounter for screening for malignant neoplasm of colon: Secondary | ICD-10-CM | POA: Diagnosis not present

## 2022-10-29 MED ORDER — PEG 3350-KCL-NA BICARB-NACL 420 G PO SOLR: 4000.0000 mL | Freq: Once | ORAL | 0 refills | Status: AC

## 2022-10-29 NOTE — Progress Notes (Signed)
  Gastroenterology Office Note    Referring Provider: Hochrein, James, MD Primary Care Physician:  Ijaola, Onyeje M, NP  Primary GI: Dr. Rourk   Chief Complaint   Chief Complaint  Patient presents with   New patient consult    Patient here today due to needing to set up his TCS and EGD. Patient says he has issues with reflux, He is taking omeprazole 40 mg daily, and has some epi gastric chest pain. Patient states has a history of heart issues. Has had stents placed at age 33, and had a heart attack at 40. Denies any other gi related issues.     History of Present Illness   Sean Rivas is a 51 y.o. male presenting today at the request of Dr. Hochrein (cardiology) due to GERD.   He has a notable cardiac history with stent placement in his early 30s, NSTEMI in 2012 s/p cardiac cath with stent, and is on Effient. He has been doing well from cardiac standpoint and was recently seen by Dr. Hochrien 09/17/22.   Notes chronic epigastric discomfort, dating back many years. Intermittent. Denies postprandial symptoms. Feels "irritated" at times.  No N/V. No dysphagia. Chronic GERD. He will eat dinner then lay down shortly after. Drinks about 5 beers per day. No overt GI bleeding.   No prior EGD or colonoscopy. No known FH colon cancer or polyps.    Past Medical History:  Diagnosis Date   Alcohol abuse    Coronary artery disease    A.  07/18/2004 - PCI/DES OM2: 2.5x13 Cypher DES Dist/3.0x13 Cypher Prox;   B.  06/2009 & 06/2011- NL Stress Echo EF 70-75%;  C. 09/21/2011 s/p STEMI 2/2 late stent thrombosis -  thrombectomy and PCI/DES 3.0 x 28 mm Promus   GERD (gastroesophageal reflux disease)    Hyperlipidemia    Hypertension    MI, acute, non ST segment elevation (HCC) 2012   Tobacco abuse    24 pack year - 1ppd as of 09/21/2011    Past Surgical History:  Procedure Laterality Date   CORONARY STENT PLACEMENT     age 33   LEFT HEART CATHETERIZATION WITH CORONARY ANGIOGRAM N/A  09/21/2011   Procedure: LEFT HEART CATHETERIZATION WITH CORONARY ANGIOGRAM;  Surgeon: Muhammad A Arida, MD;  Location: [facility] CATH LAB;  Service: Cardiovascular;  Laterality: N/A;   PERCUTANEOUS CORONARY STENT INTERVENTION (PCI-S)  09/21/2011   Procedure: PERCUTANEOUS CORONARY STENT INTERVENTION (PCI-S);  Surgeon: Muhammad A Arida, MD;  Location: [facility] CATH LAB;  Service: Cardiovascular;;    Current Outpatient Medications  Medication Sig Dispense Refill   aspirin EC 81 MG tablet Take 1 tablet (81 mg total) by mouth daily. 30 tablet 0   B-D 3CC LUER-LOK SYR 23GX1" 23G X 1" 3 ML MISC Use as directed with testosterone     Evolocumab (REPATHA) 140 MG/ML SOSY Inject 140 mg into the skin every 14 (fourteen) days. 2 mL 11   isosorbide mononitrate (IMDUR) 30 MG 24 hr tablet Take 1 tablet (30 mg total) by mouth daily. 30 tablet 0   lisinopril (ZESTRIL) 20 MG tablet Take 1 tablet (20 mg total) by mouth daily. 30 tablet 0   nitroGLYCERIN (NITROSTAT) 0.4 MG SL tablet PLACE 1 TABLET (0.4 MG TOTAL) UNDER THE TONGUE EVERY 5 (FIVE) MINUTES AS NEEDED. FOR CHEST PAIN 25 tablet 3   omeprazole (PRILOSEC) 40 MG capsule Take 40 mg by mouth daily.     polyethylene glycol-electrolytes (NULYTELY) 420 g solution Take 4,000 mLs by   mouth once for 1 dose. 4000 mL 0   prasugrel (EFFIENT) 10 MG TABS tablet TAKE 1 TABLET BY MOUTH EVERY DAY 90 tablet 3   rosuvastatin (CRESTOR) 20 MG tablet TAKE 1 TABLET BY MOUTH EVERY DAY 90 tablet 3   tadalafil (CIALIS) 5 MG tablet Take 5 mg by mouth daily as needed for erectile dysfunction.     TESTOSTERONE IM Inject into the muscle. Every six days     No current facility-administered medications for this visit.    Allergies as of 10/29/2022   (No Known Allergies)    Family History  Problem Relation Age of Onset   Coronary artery disease Mother 50       deceased @ 50   Alcohol abuse Father    Coronary artery disease Brother 36       s/p stenting   Alcohol abuse Brother    Heart  failure Brother 36       s/p ICD   Alcohol abuse Paternal Grandfather    Alcohol abuse Paternal Aunt    Alcohol abuse Paternal Uncle    Colon cancer Neg Hx    Colon polyps Neg Hx     Social History   Socioeconomic History   Marital status: Married    Spouse name: Not on file   Number of children: Not on file   Years of education: Not on file   Highest education level: Not on file  Occupational History   Occupation: Works @ [employer]    Employer: [employer]  Tobacco Use   Smoking status: Former    Packs/day: 1.00    Years: 24.00    Total pack years: 24.00    Types: Cigarettes    Quit date: 09/30/2011    Years since quitting: 11.0   Smokeless tobacco: Never  Vaping Use   Vaping Use: Never used  Substance and Sexual Activity   Alcohol use: Yes    Alcohol/week: 40.0 standard drinks of alcohol    Types: 40 Cans of beer per week    Comment: 5 beers daily   Drug use: No   Sexual activity: Yes  Other Topics Concern   Not on file  Social History Narrative   Lives in [place] with wife.  Exercises most days of the week - runs/lift weights.   Social Determinants of Health   Financial Resource Strain: Not on file  Food Insecurity: Not on file  Transportation Needs: Not on file  Physical Activity: Not on file  Stress: Not on file  Social Connections: Not on file  Intimate Partner Violence: Not on file     Review of Systems   Gen: Denies any fever, chills, fatigue, weight loss, lack of appetite.  CV: Denies chest pain, heart palpitations, peripheral edema, syncope.  Resp: Denies shortness of breath at rest or with exertion. Denies wheezing or cough.  GI: Denies dysphagia or odynophagia. Denies jaundice, hematemesis, fecal incontinence. GU : Denies urinary burning, urinary frequency, urinary hesitancy MS: Denies joint pain, muscle weakness, cramps, or limitation of movement.  Derm: Denies rash, itching, dry skin Psych: Denies depression, anxiety, memory loss, and  confusion Heme: Denies bruising, bleeding, and enlarged lymph nodes.   Physical Exam   BP (!) 143/89 (BP Location: Left Arm, Patient Position: Sitting, Cuff Size: Large)   Pulse 69   Temp 98.4 F (36.9 C) (Temporal)   Ht 5' 9" (1.753 m)   Wt 247 lb (112 kg)   BMI 36.48 kg/m  General:   Alert   and oriented. Pleasant and cooperative. Well-nourished and well-developed.  Head:  Normocephalic and atraumatic. Eyes:  Without icterus Ears:  Normal auditory acuity. Lungs:  Clear to auscultation bilaterally.  Heart:  S1, S2 present without murmurs appreciated.  Abdomen:  +BS, soft, non-tender and non-distended. No HSM noted. No guarding or rebound. No masses appreciated.  Rectal:  Deferred  Msk:  Symmetrical without gross deformities. Normal posture. Extremities:  Without edema. Neurologic:  Alert and  oriented x4;  grossly normal neurologically. Skin:  Intact without significant lesions or rashes. Psych:  Alert and cooperative. Normal mood and affect.   Assessment   Sean Rivas is a 51 y.o. male presenting today  at the request of Dr. Hochrein (cardiology) due to GERD. He has a notable cardiac history with stent placement in his early 30s, NSTEMI in 2012 s/p cardiac cath with stent, and is on Effient. He has been doing well from cardiac standpoint and was recently seen by Dr. Hochrien 09/17/22.    GERD: currently on omeprazole 40 mg daily. Noting epigastric discomfort dating back many years. He does endorse alcohol daily and Aleve/Advil pm. We discussed dietary changes including avoidance/limiting ETOH, avoiding NSAIDs, and behavior modifications. Will pursue EGD in near future in light of chronic GERD and dyspepsia.   Need for initial screening colonoscopy: no concerning lower GI signs/symptoms. No FH colorectal cancer or polyps.   Will continue on Effient.     PLAN    Proceed with colonoscopy/EGD by Dr. Rourk in near future: the risks, benefits, and alternatives have been  discussed with the patient in detail. The patient states understanding and desires to proceed.  Continue omeprazole daily.   GERD diet/behavior modifications discussed   Recommend avoidance of NSAIDs. Limit/avoid ETOH  Further recommendations to follow    Giovana Faciane W. Kathyrn Warmuth, PhD, ANP-BC [facility]    

## 2022-10-29 NOTE — Patient Instructions (Signed)
We are arranging a colonoscopy and upper endoscopy with Dr. Gala Romney.  Continue omeprazole 30 minutes prior to eating.   Avoid eating before laying down: allow for at least 2-3 hours between eating and laying down.   I have included a reflux handout as well!  Further recommendations to follow!  It was a pleasure to see you today. I want to create trusting relationships with patients and provide genuine, compassionate, and quality care. I truly value your feedback, so please be on the lookout for a survey regarding your visit with me today. I appreciate your time in completing this!    Annitta Needs, PhD, ANP-BC [facility]

## 2022-10-29 NOTE — H&P (View-Only) (Signed)
Gastroenterology Office Note    Referring Provider: Minus Breeding, MD Primary Care Physician:  Ivy Lynn, NP  Primary GI: Dr. Gala Romney   Chief Complaint   Chief Complaint  Patient presents with   New patient consult    Patient here today due to needing to set up his TCS and EGD. Patient says he has issues with reflux, He is taking omeprazole 40 mg daily, and has some epi gastric chest pain. Patient states has a history of heart issues. Has had stents placed at age 71, and had a heart attack at 46. Denies any other gi related issues.     History of Present Illness   Sean Rivas is a 52 y.o. male presenting today at the request of Dr. Percival Spanish (cardiology) due to GERD.   He has a notable cardiac history with stent placement in his early 57s, NSTEMI in 2012 s/p cardiac cath with stent, and is on Effient. He has been doing well from cardiac standpoint and was recently seen by Dr. Warren Lacy 09/17/22.   Notes chronic epigastric discomfort, dating back many years. Intermittent. Denies postprandial symptoms. Feels "irritated" at times.  No N/V. No dysphagia. Chronic GERD. He will eat dinner then lay down shortly after. Drinks about 5 beers per day. No overt GI bleeding.   No prior EGD or colonoscopy. No known FH colon cancer or polyps.    Past Medical History:  Diagnosis Date   Alcohol abuse    Coronary artery disease    A.  07/18/2004 - PCI/DES OM2: 2.5x13 Cypher DES Dist/3.0x13 Cypher Prox;   B.  06/2009 & 06/2011- NL Stress Echo EF 70-75%;  C. 09/21/2011 s/p STEMI 2/2 late stent thrombosis -  thrombectomy and PCI/DES 3.0 x 28 mm Promus   GERD (gastroesophageal reflux disease)    Hyperlipidemia    Hypertension    MI, acute, non ST segment elevation ([place]) 2012   Tobacco abuse    24 pack year - 1ppd as of 09/21/2011    Past Surgical History:  Procedure Laterality Date   CORONARY STENT PLACEMENT     age 18   LEFT HEART CATHETERIZATION WITH CORONARY ANGIOGRAM N/A  09/21/2011   Procedure: LEFT HEART CATHETERIZATION WITH CORONARY ANGIOGRAM;  Surgeon: Wellington Hampshire, MD;  Location: [facility] CATH [facility];  Service: Cardiovascular;  Laterality: N/A;   PERCUTANEOUS CORONARY STENT INTERVENTION (PCI-S)  09/21/2011   Procedure: PERCUTANEOUS CORONARY STENT INTERVENTION (PCI-S);  Surgeon: Wellington Hampshire, MD;  Location: [facility] CATH LAB;  Service: Cardiovascular;;    Current Outpatient Medications  Medication Sig Dispense Refill   aspirin EC 81 MG tablet Take 1 tablet (81 mg total) by mouth daily. 30 tablet 0   B-D 3CC LUER-LOK SYR 23GX1" 23G X 1" 3 ML MISC Use as directed with testosterone     Evolocumab (REPATHA) 140 MG/ML SOSY Inject 140 mg into the skin every 14 (fourteen) days. 2 mL 11   isosorbide mononitrate (IMDUR) 30 MG 24 hr tablet Take 1 tablet (30 mg total) by mouth daily. 30 tablet 0   lisinopril (ZESTRIL) 20 MG tablet Take 1 tablet (20 mg total) by mouth daily. 30 tablet 0   nitroGLYCERIN (NITROSTAT) 0.4 MG SL tablet PLACE 1 TABLET (0.4 MG TOTAL) UNDER THE TONGUE EVERY 5 (FIVE) MINUTES AS NEEDED. FOR CHEST PAIN 25 tablet 3   omeprazole (PRILOSEC) 40 MG capsule Take 40 mg by mouth daily.     polyethylene glycol-electrolytes (NULYTELY) 420 g solution Take 4,000 mLs by  mouth once for 1 dose. 4000 mL 0   prasugrel (EFFIENT) 10 MG TABS tablet TAKE 1 TABLET BY MOUTH EVERY DAY 90 tablet 3   rosuvastatin (CRESTOR) 20 MG tablet TAKE 1 TABLET BY MOUTH EVERY DAY 90 tablet 3   tadalafil (CIALIS) 5 MG tablet Take 5 mg by mouth daily as needed for erectile dysfunction.     TESTOSTERONE IM Inject into the muscle. Every six days     No current facility-administered medications for this visit.    Allergies as of 10/29/2022   (No Known Allergies)    Family History  Problem Relation Age of Onset   Coronary artery disease Mother 55       deceased @ 3   Alcohol abuse Father    Coronary artery disease Brother 42       s/p stenting   Alcohol abuse Brother    Heart  failure Brother 58       s/p ICD   Alcohol abuse Paternal Grandfather    Alcohol abuse Paternal Aunt    Alcohol abuse Paternal Uncle    Colon cancer Neg Hx    Colon polyps Neg Hx     Social History   Socioeconomic History   Marital status: Married    Spouse name: Not on file   Number of children: Not on file   Years of education: Not on file   Highest education level: Not on file  Occupational History   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  Exercises most days of the week - runs/lift weights.   Social Determinants of Health   Financial Resource Strain: Not on file  Food Insecurity: Not on file  Transportation Needs: Not on file  Physical Activity: Not on file  Stress: Not on file  Social Connections: Not on file  Intimate Partner Violence: Not on file     Review of Systems   Gen: Denies any fever, chills, fatigue, weight loss, lack of appetite.  CV: Denies chest pain, heart palpitations, peripheral edema, syncope.  Resp: Denies shortness of breath at rest or with exertion. Denies wheezing or cough.  GI: Denies dysphagia or odynophagia. Denies jaundice, hematemesis, fecal incontinence. GU : Denies urinary burning, urinary frequency, urinary hesitancy MS: Denies joint pain, muscle weakness, cramps, or limitation of movement.  Derm: Denies rash, itching, dry skin Psych: Denies depression, anxiety, memory loss, and  confusion Heme: Denies bruising, bleeding, and enlarged lymph nodes.   Physical Exam   BP (!) 143/89 (BP Location: Left Arm, Patient Position: Sitting, Cuff Size: Large)   Pulse 69   Temp 98.4 F (36.9 C) (Temporal)   Ht '5\' 9"'$  (1.753 m)   Wt 247 lb (112 kg)   BMI 36.48 kg/m  General:   Alert  and oriented. Pleasant and cooperative. Well-nourished and well-developed.  Head:  Normocephalic and atraumatic. Eyes:  Without icterus Ears:  Normal auditory acuity. Lungs:  Clear to auscultation bilaterally.  Heart:  S1, S2 present without murmurs appreciated.  Abdomen:  +BS, soft, non-tender and non-distended. No HSM noted. No guarding or rebound. No masses appreciated.  Rectal:  Deferred  Msk:  Symmetrical without gross deformities. Normal posture. Extremities:  Without edema. Neurologic:  Alert and  oriented x4;  grossly normal neurologically. Skin:  Intact without significant lesions or rashes. Psych:  Alert and cooperative. Normal mood and affect.   Assessment   Sean Rivas is a 52 y.o. male presenting today  at the request of Dr. Percival Spanish (cardiology) due to GERD. He has a notable cardiac history with stent placement in his early 80s, NSTEMI in 2012 s/p cardiac cath with stent, and is on Effient. He has been doing well from cardiac standpoint and was recently seen by Dr. Warren Lacy 09/17/22.    GERD: currently on omeprazole 40 mg daily. Noting epigastric discomfort dating back many years. He does endorse alcohol daily and Aleve/Advil pm. We discussed dietary changes including avoidance/limiting ETOH, avoiding NSAIDs, and behavior modifications. Will pursue EGD in near future in light of chronic GERD and dyspepsia.   Need for initial screening colonoscopy: no concerning lower GI signs/symptoms. No FH colorectal cancer or polyps.   Will continue on Effient.     PLAN    Proceed with colonoscopy/EGD by Dr. Gala Romney in near future: the risks, benefits, and alternatives have been  discussed with the patient in detail. The patient states understanding and desires to proceed.  Continue omeprazole daily.   GERD diet/behavior modifications discussed   Recommend avoidance of NSAIDs. Limit/avoid ETOH  Further recommendations to follow    Annitta Needs, PhD, ANP-BC [facility]

## 2022-11-19 ENCOUNTER — Encounter: Payer: Self-pay | Admitting: *Deleted

## 2022-11-24 NOTE — Patient Instructions (Signed)
CARTHEL JANNUSCH  11/24/2022     '@PREFPERIOPPHARMACY'$ @   Your procedure is scheduled on 11/27/2022.   Report to [facility] at  [place].   Call this number if you have problems the morning of surgery:  [phone]  If you experience any cold or flu symptoms such as cough, fever, chills, shortness of breath, etc. between now and your scheduled surgery, please notify us at the above number.   Remember:  Follow the diet and prep instructions given to you by the office.     Take these medicines the morning of surgery with A SIP OF WATER                             isosorbide, omeprazole.     Do not wear jewelry, make-up or nail polish.  Do not wear lotions, powders, or perfumes, or deodorant.  Do not shave 48 hours prior to surgery.  Men may shave face and neck.  Do not bring valuables to the hospital.  [facility] is not responsible for any belongings or valuables.  Contacts, dentures or bridgework may not be worn into surgery.  Leave your suitcase in the car.  After surgery it may be brought to your room.  For patients admitted to the hospital, discharge time will be determined by your treatment team.  Patients discharged the day of surgery will not be allowed to drive home and must have someone with them for 24 hours.    Special instructions:   DO NOT smoke tobacco or vape for 24 hours before your procedure.  Please read over the following fact sheets that you were given. Anesthesia Post-op Instructions and Care and Recovery After Surgery      Colonoscopy, Adult, Care After The following information offers guidance on how to care for yourself after your procedure. Your health care provider may also give you more specific instructions. If you have problems or questions, contact your health care provider. What can I expect after the procedure? After the procedure, it is common to have: A small amount of blood in your stool for 24 hours after the  procedure. Some gas. Mild cramping or bloating of your abdomen. Follow these instructions at home: Eating and drinking  Drink enough fluid to keep your urine pale yellow. Follow instructions from your health care provider about eating or drinking restrictions. Resume your normal diet as told by your health care provider. Avoid heavy or fried foods that are hard to digest. Activity Rest as told by your health care provider. Avoid sitting for a long time without moving. Get up to take short walks every 1-2 hours. This is important to improve blood flow and breathing. Ask for help if you feel weak or unsteady. Return to your normal activities as told by your health care provider. Ask your health care provider what activities are safe for you. Managing cramping and bloating  Try walking around when you have cramps or feel bloated. If directed, apply heat to your abdomen as told by your health care provider. Use the heat source that your health care provider recommends, such as a moist heat pack or a heating pad. Place a towel between your skin and the heat source. Leave the heat on for 20-30 minutes. Remove the heat if your skin turns bright red. This is especially important if you are unable to feel pain, heat, or cold. You have  a greater risk of getting burned. General instructions If you were given a sedative during the procedure, it can affect you for several hours. Do not drive or operate machinery until your health care provider says that it is safe. For the first 24 hours after the procedure: Do not sign important documents. Do not drink alcohol. Do your regular daily activities at a slower pace than normal. Eat soft foods that are easy to digest. Take over-the-counter and prescription medicines only as told by your health care provider. Keep all follow-up visits. This is important. Contact a health care provider if: You have blood in your stool 2-3 days after the procedure. Get  help right away if: You have more than a small spotting of blood in your stool. You have large blood clots in your stool. You have swelling of your abdomen. You have nausea or vomiting. You have a fever. You have increasing pain in your abdomen that is not relieved with medicine. These symptoms may be an emergency. Get help right away. Call 911. Do not wait to see if the symptoms will go away. Do not drive yourself to the hospital. Summary After the procedure, it is common to have a small amount of blood in your stool. You may also have mild cramping and bloating of your abdomen. If you were given a sedative during the procedure, it can affect you for several hours. Do not drive or operate machinery until your health care provider says that it is safe. Get help right away if you have a lot of blood in your stool, nausea or vomiting, a fever, or increased pain in your abdomen. This information is not intended to replace advice given to you by your health care provider. Make sure you discuss any questions you have with your health care provider. Document Revised: 05/08/2021 Document Reviewed: 05/08/2021 Elsevier Patient Education  [place] After The following information offers guidance on how to care for yourself after your procedure. Your health care provider may also give you more specific instructions. If you have problems or questions, contact your health care provider. What can I expect after the procedure? After the procedure, it is common to have: Tiredness. Little or no memory about what happened during or after the procedure. Impaired judgment when it comes to making decisions. Nausea or vomiting. Some trouble with balance. Follow these instructions at home: For the time period you were told by your health care provider:  Rest. Do not participate in activities where you could fall or become injured. Do not drive or use machinery. Do  not drink alcohol. Do not take sleeping pills or medicines that cause drowsiness. Do not make important decisions or sign legal documents. Do not take care of children on your own. Medicines Take over-the-counter and prescription medicines only as told by your health care provider. If you were prescribed antibiotics, take them as told by your health care provider. Do not stop using the antibiotic even if you start to feel better. Eating and drinking Follow instructions from your health care provider about what you may eat and drink. Drink enough fluid to keep your urine pale yellow. If you vomit: Drink clear fluids slowly and in small amounts as you are able. Clear fluids include water, ice chips, low-calorie sports drinks, and fruit juice that has water added to it (diluted fruit juice). Eat light and bland foods in small amounts as you are able. These foods include bananas, applesauce, rice, lean  meats, toast, and crackers. General instructions  Have a responsible adult stay with you for the time you are told. It is important to have someone help care for you until you are awake and alert. If you have sleep apnea, surgery and some medicines can increase your risk for breathing problems. Follow instructions from your health care provider about wearing your sleep device: When you are sleeping. This includes during daytime naps. While taking prescription pain medicines, sleeping medicines, or medicines that make you drowsy. Do not use any products that contain nicotine or tobacco. These products include cigarettes, chewing tobacco, and vaping devices, such as e-cigarettes. If you need help quitting, ask your health care provider. Contact a health care provider if: You feel nauseous or vomit every time you eat or drink. You feel light-headed. You are still sleepy or having trouble with balance after 24 hours. You get a rash. You have a fever. You have redness or swelling around the IV  site. Get help right away if: You have trouble breathing. You have new confusion after you get home. These symptoms may be an emergency. Get help right away. Call 911. Do not wait to see if the symptoms will go away. Do not drive yourself to the hospital. This information is not intended to replace advice given to you by your health care provider. Make sure you discuss any questions you have with your health care provider. Document Revised: 02/10/2022 Document Reviewed: 02/10/2022 Elsevier Patient Education  [place].

## 2022-11-25 ENCOUNTER — Encounter (HOSPITAL_COMMUNITY)
Admission: RE | Admit: 2022-11-25 | Discharge: 2022-11-25 | Disposition: A | Discharge status: Home / Self Care | Payer: BC Managed Care – PPO | Source: Ambulatory Visit | Attending: Internal Medicine | Admitting: Internal Medicine

## 2022-11-25 ENCOUNTER — Encounter (HOSPITAL_COMMUNITY): Payer: Self-pay

## 2022-11-25 VITALS — BP 146/95 | HR 80 | Temp 97.6°F | Resp 18 | Ht 69.0 in | Wt 246.9 lb

## 2022-11-25 DIAGNOSIS — K2289 Other specified disease of esophagus: Secondary | ICD-10-CM | POA: Diagnosis not present

## 2022-11-25 DIAGNOSIS — F10239 Alcohol dependence with withdrawal, unspecified: Secondary | ICD-10-CM | POA: Insufficient documentation

## 2022-11-25 DIAGNOSIS — Z01812 Encounter for preprocedural laboratory examination: Secondary | ICD-10-CM | POA: Insufficient documentation

## 2022-11-25 DIAGNOSIS — Z1211 Encounter for screening for malignant neoplasm of colon: Secondary | ICD-10-CM | POA: Diagnosis present

## 2022-11-25 DIAGNOSIS — K219 Gastro-esophageal reflux disease without esophagitis: Secondary | ICD-10-CM | POA: Diagnosis not present

## 2022-11-25 DIAGNOSIS — D125 Benign neoplasm of sigmoid colon: Secondary | ICD-10-CM | POA: Diagnosis not present

## 2022-11-25 DIAGNOSIS — F419 Anxiety disorder, unspecified: Secondary | ICD-10-CM | POA: Diagnosis not present

## 2022-11-25 DIAGNOSIS — Z955 Presence of coronary angioplasty implant and graft: Secondary | ICD-10-CM | POA: Diagnosis not present

## 2022-11-25 DIAGNOSIS — K449 Diaphragmatic hernia without obstruction or gangrene: Secondary | ICD-10-CM | POA: Diagnosis not present

## 2022-11-25 DIAGNOSIS — R12 Heartburn: Secondary | ICD-10-CM | POA: Diagnosis not present

## 2022-11-25 DIAGNOSIS — I252 Old myocardial infarction: Secondary | ICD-10-CM | POA: Diagnosis not present

## 2022-11-25 DIAGNOSIS — F32A Depression, unspecified: Secondary | ICD-10-CM | POA: Diagnosis not present

## 2022-11-25 DIAGNOSIS — I10 Essential (primary) hypertension: Secondary | ICD-10-CM | POA: Diagnosis not present

## 2022-11-25 DIAGNOSIS — Z87891 Personal history of nicotine dependence: Secondary | ICD-10-CM | POA: Diagnosis not present

## 2022-11-25 DIAGNOSIS — I2510 Atherosclerotic heart disease of native coronary artery without angina pectoris: Secondary | ICD-10-CM | POA: Diagnosis not present

## 2022-11-25 LAB — COMPREHENSIVE METABOLIC PANEL
ALT: 38 U/L (ref 0–44)
AST: 36 U/L (ref 15–41)
Albumin: 4.2 g/dL (ref 3.5–5.0)
Alkaline Phosphatase: 41 U/L (ref 38–126)
Anion gap: 12 (ref 5–15)
BUN: 13 mg/dL (ref 6–20)
CO2: 20 mmol/L — ABNORMAL LOW (ref 22–32)
Calcium: 8.6 mg/dL — ABNORMAL LOW (ref 8.9–10.3)
Chloride: 103 mmol/L (ref 98–111)
Creatinine, Ser: 1.06 mg/dL (ref 0.61–1.24)
GFR, Estimated: >60 mL/min (ref >60)
Glucose, Bld: 110 mg/dL — ABNORMAL HIGH (ref 70–99)
Potassium: 3.3 mmol/L — ABNORMAL LOW (ref 3.5–5.1)
Sodium: 135 mmol/L (ref 135–145)
Total Bilirubin: 0.4 mg/dL (ref 0.3–1.2)
Total Protein: 7.1 g/dL (ref 6.5–8.1)

## 2022-11-26 NOTE — Pre-Procedure Instructions (Signed)
  RE: effient Received: Today Rourk, Cristopher Estimable, MD  Encarnacion Chu, RN We will not hold Effient.       Previous Messages    ----- Message ----- From: Encarnacion Chu, RN Sent: 11/26/2022   7:13 AM EST To: Daneil Dolin, MD Subject: effient                                        Good morning Dr Gala Romney. Dr Charna Elizabeth wanted me to make sure you did not want Tremayne Polynice to hold his effient prior to his procedure tomorrow.

## 2022-11-27 ENCOUNTER — Ambulatory Visit (HOSPITAL_COMMUNITY): Payer: BC Managed Care – PPO | Admitting: Anesthesiology

## 2022-11-27 ENCOUNTER — Encounter (HOSPITAL_COMMUNITY): Payer: Self-pay | Admitting: Internal Medicine

## 2022-11-27 ENCOUNTER — Encounter (HOSPITAL_COMMUNITY)
Admission: RE | Disposition: A | Discharge status: Home / Self Care | Payer: Self-pay | Source: Home / Self Care | Attending: Internal Medicine

## 2022-11-27 ENCOUNTER — Ambulatory Visit (HOSPITAL_COMMUNITY)
Admission: RE | Admit: 2022-11-27 | Discharge: 2022-11-27 | Disposition: A | Discharge status: Home / Self Care | Payer: BC Managed Care – PPO | Attending: Internal Medicine | Admitting: Internal Medicine

## 2022-11-27 DIAGNOSIS — I10 Essential (primary) hypertension: Secondary | ICD-10-CM | POA: Insufficient documentation

## 2022-11-27 DIAGNOSIS — I252 Old myocardial infarction: Secondary | ICD-10-CM | POA: Insufficient documentation

## 2022-11-27 DIAGNOSIS — F10239 Alcohol dependence with withdrawal, unspecified: Secondary | ICD-10-CM | POA: Insufficient documentation

## 2022-11-27 DIAGNOSIS — K449 Diaphragmatic hernia without obstruction or gangrene: Secondary | ICD-10-CM | POA: Insufficient documentation

## 2022-11-27 DIAGNOSIS — Z955 Presence of coronary angioplasty implant and graft: Secondary | ICD-10-CM | POA: Insufficient documentation

## 2022-11-27 DIAGNOSIS — K2289 Other specified disease of esophagus: Secondary | ICD-10-CM | POA: Insufficient documentation

## 2022-11-27 DIAGNOSIS — Z1211 Encounter for screening for malignant neoplasm of colon: Secondary | ICD-10-CM | POA: Diagnosis not present

## 2022-11-27 DIAGNOSIS — D125 Benign neoplasm of sigmoid colon: Secondary | ICD-10-CM | POA: Insufficient documentation

## 2022-11-27 DIAGNOSIS — F419 Anxiety disorder, unspecified: Secondary | ICD-10-CM | POA: Insufficient documentation

## 2022-11-27 DIAGNOSIS — R12 Heartburn: Secondary | ICD-10-CM | POA: Diagnosis not present

## 2022-11-27 DIAGNOSIS — F32A Depression, unspecified: Secondary | ICD-10-CM | POA: Insufficient documentation

## 2022-11-27 DIAGNOSIS — K219 Gastro-esophageal reflux disease without esophagitis: Secondary | ICD-10-CM | POA: Insufficient documentation

## 2022-11-27 DIAGNOSIS — I2510 Atherosclerotic heart disease of native coronary artery without angina pectoris: Secondary | ICD-10-CM | POA: Insufficient documentation

## 2022-11-27 DIAGNOSIS — Z87891 Personal history of nicotine dependence: Secondary | ICD-10-CM | POA: Insufficient documentation

## 2022-11-27 HISTORY — PX: COLONOSCOPY WITH PROPOFOL: SHX5780

## 2022-11-27 HISTORY — PX: POLYPECTOMY: SHX5525

## 2022-11-27 HISTORY — PX: ESOPHAGOGASTRODUODENOSCOPY (EGD) WITH PROPOFOL: SHX5813

## 2022-11-27 HISTORY — PX: BIOPSY: SHX5522

## 2022-11-27 SURGERY — COLONOSCOPY WITH PROPOFOL
Anesthesia: General

## 2022-11-27 MED ORDER — DEXMEDETOMIDINE HCL IN NACL 80 MCG/20ML IV SOLN
INTRAVENOUS | Status: AC
  Filled 2022-11-27: qty 20

## 2022-11-27 MED ORDER — PHENYLEPHRINE HCL (PRESSORS) 10 MG/ML IV SOLN
INTRAVENOUS | Status: DC | PRN
  Administered 2022-11-27: 100 ug via INTRAVENOUS

## 2022-11-27 MED ORDER — PROPOFOL 500 MG/50ML IV EMUL
INTRAVENOUS | Status: DC | PRN
  Administered 2022-11-27: 150 ug/kg/min via INTRAVENOUS

## 2022-11-27 MED ORDER — PHENYLEPHRINE HCL (PRESSORS) 10 MG/ML IV SOLN
INTRAVENOUS | Status: AC
  Filled 2022-11-27: qty 1

## 2022-11-27 MED ORDER — STERILE WATER FOR IRRIGATION IR SOLN
Status: DC | PRN
  Administered 2022-11-27: 120 mL

## 2022-11-27 MED ORDER — LACTATED RINGERS IV SOLN: INTRAVENOUS | Status: DC

## 2022-11-27 MED ORDER — PROPOFOL 10 MG/ML IV BOLUS
INTRAVENOUS | Status: DC | PRN
  Administered 2022-11-27: 100 mg via INTRAVENOUS

## 2022-11-27 MED ORDER — DEXMEDETOMIDINE HCL IN NACL 80 MCG/20ML IV SOLN
INTRAVENOUS | Status: DC | PRN
  Administered 2022-11-27: 8 ug via BUCCAL

## 2022-11-27 MED ORDER — LIDOCAINE HCL (CARDIAC) PF 100 MG/5ML IV SOSY
PREFILLED_SYRINGE | INTRAVENOUS | Status: DC | PRN
  Administered 2022-11-27: 50 mg via INTRAVENOUS

## 2022-11-27 NOTE — Anesthesia Postprocedure Evaluation (Signed)
Anesthesia Post Note  Patient: Sean Rivas  Procedure(s) Performed: COLONOSCOPY WITH PROPOFOL ESOPHAGOGASTRODUODENOSCOPY (EGD) WITH PROPOFOL BIOPSY POLYPECTOMY  Patient location during evaluation: Phase II Anesthesia Type: General Level of consciousness: awake and alert and oriented Pain management: pain level controlled Vital Signs Assessment: post-procedure vital signs reviewed and stable Respiratory status: spontaneous breathing, nonlabored ventilation and respiratory function stable Cardiovascular status: blood pressure returned to baseline and stable Postop Assessment: no apparent nausea or vomiting Anesthetic complications: no  No notable events documented.   Last Vitals:  Vitals:   11/27/22 0907 11/27/22 1047  BP: (!) 134/98 104/60  Pulse: 70 64  Resp: 20 20  Temp: 36.6 C 36.6 C  SpO2: 95% 92%    Last Pain:  Vitals:   11/27/22 1054  TempSrc:   PainSc: 0-No pain                 Cambri Plourde C Joe Tanney

## 2022-11-27 NOTE — Op Note (Signed)
[facility] Patient Name: Sean Rivas Procedure Date: 11/27/2022 9:58 AM MRN: [id number] Date of Birth: 01/25/1971 Attending MD: Norvel Richards , MD, [id number] CSN: [id number] Age: 52 Admit Type: Outpatient Procedure:                Colonoscopy Indications:              Screening for colorectal malignant neoplasm Providers:                Norvel Richards, MD, Lambert Mody,                            Raphael Gibney, Technician Referring MD:              Medicines:                Propofol per Anesthesia Complications:            No immediate complications. Estimated Blood Loss:     Estimated blood loss was minimal. Estimated blood                            loss was minimal. Procedure:                Pre-Anesthesia Assessment:                           - Prior to the procedure, a History and Physical                            was performed, and patient medications and                            allergies were reviewed. The patient's tolerance of                            previous anesthesia was also reviewed. The risks                            and benefits of the procedure and the sedation                            options and risks were discussed with the patient.                            All questions were answered, and informed consent                            was obtained. ASA Grade Assessment: II - A patient                            with mild systemic disease. After reviewing the                            risks and benefits, the patient was deemed in  satisfactory condition to undergo the procedure.                           After obtaining informed consent, the colonoscope                            was passed under direct vision. Throughout the                            procedure, the patient's blood pressure, pulse, and                            oxygen saturations were monitored continuously. The                             [id number]) scope was introduced through the                            anus and advanced to the the cecum, identified by                            appendiceal orifice and ileocecal valve. The                            colonoscopy was performed without difficulty. The                            patient tolerated the procedure well. The quality                            of the bowel preparation was adequate. The                            ileocecal valve, appendiceal orifice, and rectum                            were photographed. The entire colon was well                            visualized. Scope In: 10:29:32 AM Scope Out: 10:45:13 AM Scope Withdrawal Time: 0 hours 6 minutes 57 seconds  Total Procedure Duration: 0 hours 15 minutes 41 seconds  Findings:      The perianal and digital rectal examinations were normal.      A 6 mm polyp was found in the sigmoid colon. The polyp was sessile. The       polyp was removed with a cold snare. Resection and retrieval were       complete. Estimated blood loss was minimal.      The exam was otherwise without abnormality on direct and retroflexion       views. Impression:               - One 6 mm polyp in the sigmoid colon, removed with  a cold snare. Resected and retrieved.                           - The examination was otherwise normal on direct                            and retroflexion views. Moderate Sedation:      Moderate (conscious) sedation was personally administered by an       anesthesia professional. The following parameters were monitored: oxygen       saturation, heart rate, blood pressure, respiratory rate, EKG, adequacy       of pulmonary ventilation, and response to care. Recommendation:           - Patient has a contact number available for                            emergencies. The signs and symptoms of potential                            delayed complications were discussed with  the                            patient. Return to normal activities tomorrow.                            Written discharge instructions were provided to the                            patient.                           - Advance diet as tolerated.                           - Continue present medications.                           - Repeat colonoscopy date to be determined after                            pending pathology results are reviewed for                            surveillance.                           - Return to GI office (date not yet determined).                            See EGD report. Procedure Code(s):        --- Professional ---                           [phone], Colonoscopy, flexible; with removal of  tumor(s), polyp(s), or other lesion(s) by snare                            technique Diagnosis Code(s):        --- Professional ---                           Z12.11, Encounter for screening for malignant                            neoplasm of colon                           D12.5, Benign neoplasm of sigmoid colon CPT copyright 2022 American Medical Association. All rights reserved. The codes documented in this report are preliminary and upon coder review may  be revised to meet current compliance requirements. Cristopher Estimable. Pius Byrom, MD Norvel Richards, MD 11/27/2022 10:50:58 AM This report has been signed electronically. Number of Addenda: 0

## 2022-11-27 NOTE — Anesthesia Preprocedure Evaluation (Signed)
Anesthesia Evaluation  Patient identified by MRN, date of birth, ID band Patient awake    Reviewed: Allergy & Precautions, H&P , NPO status , Patient's Chart, lab work & pertinent test results  Airway        Dental  (+) Dental Advisory Given   Pulmonary neg pulmonary ROS, former smoker   Pulmonary exam normal breath sounds clear to auscultation       Cardiovascular Exercise Tolerance: Good hypertension, Pt. on medications + CAD, + Past MI and + Cardiac Stents  Normal cardiovascular exam Rhythm:Regular Rate:Normal     Neuro/Psych  PSYCHIATRIC DISORDERS Anxiety Depression    negative neurological ROS     GI/Hepatic ,GERD  Medicated and Controlled,,(+)     substance abuse (20-30 beers/week)  alcohol use  Endo/Other  negative endocrine ROS    Renal/GU negative Renal ROS  negative genitourinary   Musculoskeletal negative musculoskeletal ROS (+)    Abdominal   Peds negative pediatric ROS (+)  Hematology negative hematology ROS (+)   Anesthesia Other Findings   Reproductive/Obstetrics negative OB ROS                             Anesthesia Physical Anesthesia Plan  ASA: 3  Anesthesia Plan: General   Post-op Pain Management: Minimal or no pain anticipated   Induction: Intravenous  PONV Risk Score and Plan: Propofol infusion  Airway Management Planned: Nasal Cannula and Natural Airway  Additional Equipment:   Intra-op Plan:   Post-operative Plan:   Informed Consent: I have reviewed the patients History and Physical, chart, labs and discussed the procedure including the risks, benefits and alternatives for the proposed anesthesia with the patient or authorized representative who has indicated his/her understanding and acceptance.     Dental advisory given  Plan Discussed with: CRNA and Surgeon  Anesthesia Plan Comments:         Anesthesia Quick Evaluation

## 2022-11-27 NOTE — Transfer of Care (Signed)
Immediate Anesthesia Transfer of Care Note  Patient: Sean Rivas  Procedure(s) Performed: COLONOSCOPY WITH PROPOFOL ESOPHAGOGASTRODUODENOSCOPY (EGD) WITH PROPOFOL BIOPSY POLYPECTOMY  Patient Location: Short Stay  Anesthesia Type:General  Level of Consciousness: drowsy  Airway & Oxygen Therapy: Patient Spontanous Breathing and Patient connected to nasal cannula oxygen  Post-op Assessment: Report given to RN and Post -op Vital signs reviewed and stable  Post vital signs: Reviewed and stable  Last Vitals:  Vitals Value Taken Time  BP 104/60 11/27/22 1047  Temp 36.6 C 11/27/22 1047  Pulse 64 11/27/22 1047  Resp 20 11/27/22 1047  SpO2 92 % 11/27/22 1047    Last Pain:  Vitals:   11/27/22 1047  TempSrc: Oral  PainSc: Asleep         Complications: No notable events documented.

## 2022-11-27 NOTE — Op Note (Signed)
[facility] Patient Name: Sean Rivas Procedure Date: 11/27/2022 9:59 AM MRN: [id number] Date of Birth: 1971-09-25 Attending MD: Norvel Richards , MD, [id number] CSN: [id number] Age: 52 Admit Type: Outpatient Procedure:                Upper GI endoscopy Indications:              Heartburn, Suspected esophageal reflux Providers:                Norvel Richards, MD, Lambert Mody,                            Raphael Gibney, Technician Referring MD:              Medicines:                Propofol per Anesthesia Complications:            No immediate complications. Estimated Blood Loss:     Estimated blood loss was minimal. Procedure:                Pre-Anesthesia Assessment:                           - Prior to the procedure, a History and Physical                            was performed, and patient medications and                            allergies were reviewed. The patient's tolerance of                            previous anesthesia was also reviewed. The risks                            and benefits of the procedure and the sedation                            options and risks were discussed with the patient.                            All questions were answered, and informed consent                            was obtained. Prior Anticoagulants: The patient                            last took Effient (prasugrel) 1 day prior to the                            procedure. ASA Grade Assessment: III - A patient                            with severe systemic disease. After reviewing the  risks and benefits, the patient was deemed in                            satisfactory condition to undergo the procedure.                           After obtaining informed consent, the endoscope was                            passed under direct vision. Throughout the                            procedure, the patient's blood pressure, pulse, and                             oxygen saturations were monitored continuously. The                            [id number]) scope was introduced through the                            mouth, and advanced to the second part of duodenum.                            The upper GI endoscopy was accomplished without                            difficulty. The patient tolerated the procedure                            well. Scope In: 10:18:19 AM Scope Out: 10:23:42 AM Total Procedure Duration: 0 hours 5 minutes 23 seconds  Findings:      No esophagitis. (3) 2 to 3 mm islands of salmon-colored epithelium just       above the EG junction. No nodularity.      A small hiatal hernia was present. Normal-appearing gastric mucosa.       Patent pylorus.      The duodenal bulb and second portion of the duodenum were normal. Tiny       islands of salmon-colored epithelium biopsied. Impression:               - Small hiatal hernia. Tiny islands of                            salmon-colored epithelium distal esophagus. Status                            post biopsy. Could represent early Barrett's                            transformation.                           - Normal duodenal bulb and second portion of the  duodenum.                           - Moderate Sedation:      Moderate (conscious) sedation was personally administered by an       anesthesia professional. The following parameters were monitored: oxygen       saturation, heart rate, blood pressure, respiratory rate, EKG, adequacy       of pulmonary ventilation, and response to care. Recommendation:           - Patient has a contact number available for                            emergencies. The signs and symptoms of potential                            delayed complications were discussed with the                            patient. Return to normal activities tomorrow.                            Written discharge instructions  were provided to the                            patient.                           - Advance diet as tolerated. Continue omeprazole 40                            mg daily take 30 minutes before a meal. Limit food                            drink and take within 3 hours of going to bed. May                            use Pepcid [facility] for breakthrough symptoms. Consider                            reflux G[employer]met as well ([email or web address]) office visit                            with [place] in 3 months. Further recommendations to                            follow pending review of pathology report. See                            colonoscopy report. Procedure Code(s):        --- Professional ---                           [phone], Esophagogastroduodenoscopy, flexible,  transoral; diagnostic, including collection of                            specimen(s) by brushing or washing, when performed                            (separate procedure) Diagnosis Code(s):        --- Professional ---                           K44.9, Diaphragmatic hernia without obstruction or                            gangrene                           R12, Heartburn CPT copyright 2022 American Medical Association. All rights reserved. The codes documented in this report are preliminary and upon coder review may  be revised to meet current compliance requirements. Sean Rivas. Sean Messineo, MD Norvel Richards, MD 11/27/2022 10:47:34 AM This report has been signed electronically. Number of Addenda: 0

## 2022-11-27 NOTE — Discharge Instructions (Addendum)
Colonoscopy Discharge Instructions  Read the instructions outlined below and refer to this sheet in the next few weeks. These discharge instructions provide you with general information on caring for yourself after you leave the hospital. Your doctor may also give you specific instructions. While your treatment has been planned according to the most current medical practices available, unavoidable complications occasionally occur. If you have any problems or questions after discharge, call Dr. Gala Romney at [phone]. ACTIVITY You may resume your regular activity, but move at a slower pace for the next 24 hours.  Take frequent rest periods for the next 24 hours.  Walking will help get rid of the air and reduce the bloated feeling in your belly (abdomen).  No driving for 24 hours (because of the medicine (anesthesia) used during the test).   Do not sign any important legal documents or operate any machinery for 24 hours (because of the anesthesia used during the test).  NUTRITION Drink plenty of fluids.  You may resume your normal diet as instructed by your doctor.  Begin with a light meal and progress to your normal diet. Heavy or fried foods are harder to digest and may make you feel sick to your stomach (nauseated).  Avoid alcoholic beverages for 24 hours or as instructed.  MEDICATIONS You may resume your normal medications unless your doctor tells you otherwise.  WHAT YOU CAN EXPECT TODAY Some feelings of bloating in the abdomen.  Passage of more gas than usual.  Spotting of blood in your stool or on the toilet paper.  IF YOU HAD POLYPS REMOVED DURING THE COLONOSCOPY: No aspirin products for 7 days or as instructed.  No alcohol for 7 days or as instructed.  Eat a soft diet for the next 24 hours.  FINDING OUT THE RESULTS OF YOUR TEST Not all test results are available during your visit. If your test results are not back during the visit, make an appointment with your caregiver to find out the  results. Do not assume everything is normal if you have not heard from your caregiver or the medical facility. It is important for you to follow up on all of your test results.  SEEK IMMEDIATE MEDICAL ATTENTION IF: You have more than a spotting of blood in your stool.  Your belly is swollen (abdominal distention).  You are nauseated or vomiting.  You have a temperature over 101.  You have abdominal pain or discomfort that is severe or gets worse throughout the day.   EGD Discharge instructions Please read the instructions outlined below and refer to this sheet in the next few weeks. These discharge instructions provide you with general information on caring for yourself after you leave the hospital. Your doctor may also give you specific instructions. While your treatment has been planned according to the most current medical practices available, unavoidable complications occasionally occur. If you have any problems or questions after discharge, please call your doctor. ACTIVITY You may resume your regular activity but move at a slower pace for the next 24 hours.  Take frequent rest periods for the next 24 hours.  Walking will help expel (get rid of) the air and reduce the bloated feeling in your abdomen.  No driving for 24 hours (because of the anesthesia (medicine) used during the test).  You may shower.  Do not sign any important legal documents or operate any machinery for 24 hours (because of the anesthesia used during the test).  NUTRITION Drink plenty of fluids.  You may  resume your normal diet.  Begin with a light meal and progress to your normal diet.  Avoid alcoholic beverages for 24 hours or as instructed by your caregiver.  MEDICATIONS You may resume your normal medications unless your caregiver tells you otherwise.  WHAT YOU CAN EXPECT TODAY You may experience abdominal discomfort such as a feeling of fullness or "gas" pains.  FOLLOW-UP Your doctor will discuss the results of  your test with you.  SEEK IMMEDIATE MEDICAL ATTENTION IF ANY OF THE FOLLOWING OCCUR: Excessive nausea (feeling sick to your stomach) and/or vomiting.  Severe abdominal pain and distention (swelling).  Trouble swallowing.  Temperature over 101 F (37.8 C).  Rectal bleeding or vomiting of blood.       1 polyp found and removed from your colon.  Biopsies of your esophagus were taken.    Further recommendations to follow once the lab report is available for review   continue omeprazole 40 mg daily best taken 30 minutes before meal.  May use Pepcid AC or reflux Gourmet (over-the-counter/[employer]) for any reflux not controlled with omeprazole  Limit food intake within 3 hours of going to bed  Limit beer consumption to  no more than 2 daily.    Office visit with [place] in 3 months  At patient request, I called Shanna Rugar at [phone] -

## 2022-11-27 NOTE — Interval H&P Note (Signed)
History and Physical Interval Note:  11/27/2022 10:03 AM  Sean Rivas  has presented today for surgery, with the diagnosis of screening colonoscopy, gerd, dyspepsia.  The various methods of treatment have been discussed with the patient and family. After consideration of risks, benefits and other options for treatment, the patient has consented to  Procedure(s) with comments: COLONOSCOPY WITH PROPOFOL (N/A) - 9:45am, asa 3 ESOPHAGOGASTRODUODENOSCOPY (EGD) WITH PROPOFOL (N/A) as a surgical intervention.  The patient's history has been reviewed, patient examined, no change in status, stable for surgery.  I have reviewed the patient's chart and labs.  Questions were answered to the patient's satisfaction.     Sean Rivas     No change.  Patient states he really does well when he takes omeprazole 40 mg before supper.  Big challenges eating just before he goes to bed.  No dysphagia.   EGD now being done along with first ever screening colonoscopy.  The risks, benefits, limitations, imponderables and alternatives regarding both EGD and colonoscopy have been reviewed with the patient. Questions have been answered. All parties agreeable.

## 2022-11-28 LAB — SURGICAL PATHOLOGY

## 2022-11-30 ENCOUNTER — Encounter: Payer: Self-pay | Admitting: Internal Medicine

## 2022-12-10 ENCOUNTER — Encounter (HOSPITAL_COMMUNITY): Payer: Self-pay | Admitting: Internal Medicine

## 2022-12-11 ENCOUNTER — Encounter: Payer: Self-pay | Admitting: Internal Medicine

## 2023-01-09 ENCOUNTER — Other Ambulatory Visit (HOSPITAL_COMMUNITY): Payer: Self-pay

## 2023-01-13 ENCOUNTER — Telehealth: Payer: Self-pay | Admitting: Nurse Practitioner

## 2023-01-13 NOTE — Telephone Encounter (Signed)
Sean Rivas (Sean Rivas) Rx #: [id number] Repatha /ML syringes Form [employer] Electronic PA Form [phone] NCPDP) Created 10 days ago Sent to Plan 7 days ago Plan Response 7 days ago Submit Clinical Questions 7 days ago Determination Favorable 7 days ago Message from [employer] Your PA request has been approved. Additional information will be provided in the approval communication. (Message 1145). Authorization Expiration Date: January 06, 2024.

## 2023-02-07 ENCOUNTER — Other Ambulatory Visit: Payer: Self-pay | Admitting: Cardiology

## 2023-04-08 ENCOUNTER — Other Ambulatory Visit: Payer: Self-pay | Admitting: Cardiology

## 2023-08-15 ENCOUNTER — Other Ambulatory Visit: Payer: Self-pay | Admitting: Cardiology

## 2023-09-19 ENCOUNTER — Other Ambulatory Visit: Payer: Self-pay | Admitting: Cardiology

## 2023-10-06 NOTE — Progress Notes (Signed)
 Cardiology Office Note:   Date:  10/07/2023  ID:  Sean Rivas, DOB 1971/03/15, MRN [id number] PCP: Cherylene Homer HERO, NP (Inactive)  [facility] Providers Cardiologist:  None {  History of Present Illness:   Sean Rivas is a 53 y.o. male who presents for follow-up coronary artery disease.  He had a stent to an obtuse marginal in 2005.  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  There was a second stent in that vessel with disease after the first stent.  And nonobstructive disease elsewhere.  He had thrombectomy and DES placement to the OM 2.  It was recommended at that time he continue DAPT indefinitely.  Follow-up perfusion study in 2018 demonstrated no scar or ischemia with a well-preserved ejection fraction.    This is his first appointment with me.   Since he was last seen he has had no new cardiovascular complaints.  He was having reflux when I last saw him and has not seen a gastroenterologist and this is improved.  He does have mild snoring.  He has daytime somnolence.  He seemed to be apneic.  STOP-BANG score is 5.  He exercises routinely.  He is not describing chest pressure, neck or arm discomfort.  He has not had any new shortness of breath, PND or orthopnea.  He has no weight gain or edema.  Of note he does drink several beers a week.    ROS: As stated in the HPI and negative for all other systems.  Studies Reviewed:    EKG:   EKG Interpretation Date/Time:  Wednesday October 07 2023 09:58:31 EST Ventricular Rate:  84 PR Interval:  156 QRS Duration:  94 QT Interval:  356 QTC Calculation: 420 R Axis:   -2  Text Interpretation: Normal sinus rhythm Nonspecific T wave abnormality When compared with ECG of 14-May-2018 16:54, No significant change since last tracing Confirmed by Lavona Agent ([id number]) on 10/07/2023 10:37:03 AM     Risk Assessment/Calculations:        Physical Exam:   VS:  BP (!) 130/100   Pulse 84   Ht 5' 9 (1.753 m)   Wt 245 lb (111.1 kg)   BMI 36.18 kg/m    Wt Readings from Last 3 Encounters:  10/07/23 245 lb (111.1 kg)  11/27/22 240 lb (108.9 kg)  11/25/22 246 lb 14.6 oz (112 kg)     GEN: Well nourished, well developed in no acute distress NECK: No JVD; No carotid bruits CARDIAC: RRR, no murmurs, rubs, gallops RESPIRATORY:  Clear to auscultation without rales, wheezing or rhonchi  ABDOMEN: Soft, non-tender, non-distended EXTREMITIES:  No edema; No deformity   ASSESSMENT AND PLAN:   CAD:   The patient has no new sypmtoms.  No further cardiovascular testing is indicated.  We will continue with aggressive risk reduction and meds as listed.   DYSLIPIDEMIA: LDL is 19 mm last check.  No change in therapy.    HTN: Blood pressure is not at target.  He has not been taking his beta-blocker.  I am going to increase his lisinopril  to 40 mg daily and he is going to get a blood pressure cuff and keep a blood pressure diary.   SLEEP APNEA: See the STOP-BANG above.  I am going to order split night sleep study.     Follow up with me in 1 year  Signed, Agent Lavona, MD

## 2023-10-07 ENCOUNTER — Encounter: Payer: Self-pay | Admitting: *Deleted

## 2023-10-07 ENCOUNTER — Ambulatory Visit: Payer: Self-pay | Admitting: Cardiology

## 2023-10-07 ENCOUNTER — Encounter: Payer: Self-pay | Admitting: Cardiology

## 2023-10-07 VITALS — BP 130/100 | HR 84 | Ht 69.0 in | Wt 245.0 lb

## 2023-10-07 DIAGNOSIS — I10 Essential (primary) hypertension: Secondary | ICD-10-CM

## 2023-10-07 DIAGNOSIS — E785 Hyperlipidemia, unspecified: Secondary | ICD-10-CM

## 2023-10-07 DIAGNOSIS — I2510 Atherosclerotic heart disease of native coronary artery without angina pectoris: Secondary | ICD-10-CM

## 2023-10-07 DIAGNOSIS — R0683 Snoring: Secondary | ICD-10-CM | POA: Diagnosis not present

## 2023-10-07 DIAGNOSIS — R400 Somnolence: Secondary | ICD-10-CM

## 2023-10-07 MED ORDER — LISINOPRIL 40 MG PO TABS: 40.0000 mg | ORAL_TABLET | Freq: Every day | ORAL | 3 refills | Status: DC

## 2023-10-07 MED ORDER — NITROGLYCERIN 0.4 MG SL SUBL: 0.4000 mg | SUBLINGUAL_TABLET | SUBLINGUAL | 3 refills | Status: AC | PRN

## 2023-10-07 NOTE — Patient Instructions (Signed)
 Medication Instructions:  Please discontinue your Metoprolol . Increase Lisinopril  to 40 mg daily. Take SL Nitroglycerin  as needed for chest pain. Continue all other medications as listed.  *If you need a refill on your cardiac medications before your next appointment, please call your pharmacy*  Testing/Procedures: Your physician has recommended that you have a sleep study. This test records several body functions during sleep, including: brain activity, eye movement, oxygen and carbon dioxide blood levels, heart rate and rhythm, breathing rate and rhythm, the flow of air through your mouth and nose, snoring, body muscle movements, and chest and belly movement.  You will be contact to be scheduled for this testing.  Follow-Up: At [facility], you and your health needs are our priority.  As part of our continuing mission to provide you with exceptional heart care, we have created designated Provider Care Teams.  These Care Teams include your primary Cardiologist (physician) and Advanced Practice Providers (APPs -  Physician Assistants and Nurse Practitioners) who all work together to provide you with the care you need, when you need it.  We recommend signing up for the patient portal called MyChart.  Sign up information is provided on this After Visit Summary.  MyChart is used to connect with patients for Virtual Visits (Telemedicine).  Patients are able to view lab/test results, encounter notes, upcoming appointments, etc.  Non-urgent messages can be sent to your provider as well.   To learn more about what you can do with MyChart, go to [email or web address].    Your next appointment:   1 year(s)  Provider:   Lynwood Schilling, MD     [employer]ron Blood Pressure monitor.

## 2023-11-30 ENCOUNTER — Telehealth: Payer: Self-pay

## 2023-11-30 NOTE — Telephone Encounter (Signed)
**Note De-Identified Mishika Flippen Obfuscation** [employer] website: [facility] does not require a prior authorization for CPT Code: 16109 (Split Night Sleep Study).  I have forwarded the Split Night Sleep Study order to the sleep lab so they can contact the pt to schedule this test.

## 2024-01-13 ENCOUNTER — Other Ambulatory Visit: Payer: Self-pay | Admitting: Cardiology

## 2024-01-30 ENCOUNTER — Other Ambulatory Visit: Payer: Self-pay | Admitting: Cardiology

## 2024-02-01 NOTE — Addendum Note (Signed)
 Addended by: Iyanni Hepp on: 02/01/2024 05:13 PM   Modules accepted: Orders

## 2024-02-03 ENCOUNTER — Telehealth: Payer: Self-pay | Admitting: Pharmacy Technician

## 2024-02-03 ENCOUNTER — Other Ambulatory Visit (HOSPITAL_COMMUNITY): Payer: Self-pay

## 2024-02-03 MED ORDER — REPATHA 140 MG/ML ~~LOC~~ SOSY: 140.0000 mg | PREFILLED_SYRINGE | SUBCUTANEOUS | 3 refills | Status: AC

## 2024-02-03 NOTE — Addendum Note (Signed)
 Addended by: Nonnie Pickney D on: 02/03/2024 02:44 PM   Modules accepted: Orders

## 2024-02-03 NOTE — Telephone Encounter (Signed)
 Pharmacy Patient Advocate Encounter   Received notification from CoverMyMeds that prior authorization for Repatha  is required/requested.   Insurance verification completed.   The patient is insured through [facility] ADVANTAGE/RX ADVANCE .   Per test claim: PA required; PA submitted to above mentioned insurance via CoverMyMeds Key/confirmation #/EOC [id number] Status is pending

## 2024-02-03 NOTE — Telephone Encounter (Signed)
 Pharmacy Patient Advocate Encounter  Received notification from [employer] that Prior Authorization for Repatha  has been APPROVED from 02/03/24 to 02/02/25   PA #/Case ID/Reference #: [id number]

## 2024-02-12 ENCOUNTER — Ambulatory Visit (HOSPITAL_BASED_OUTPATIENT_CLINIC_OR_DEPARTMENT_OTHER): Admitting: Cardiology

## 2024-06-01 ENCOUNTER — Encounter (HOSPITAL_BASED_OUTPATIENT_CLINIC_OR_DEPARTMENT_OTHER): Admitting: Cardiology

## 2024-09-20 ENCOUNTER — Other Ambulatory Visit: Payer: Self-pay | Admitting: Cardiology

## 2024-10-08 ENCOUNTER — Other Ambulatory Visit: Payer: Self-pay | Admitting: Cardiology

## 2024-10-23 ENCOUNTER — Telehealth: Payer: Self-pay | Admitting: Cardiology

## 2024-10-23 ENCOUNTER — Other Ambulatory Visit: Payer: Self-pay | Admitting: Cardiology

## 2024-10-27 NOTE — Telephone Encounter (Signed)
" °*  STAT* If patient is at the pharmacy, call can be transferred to refill team.   1. Which medications need to be refilled? (please list name of each medication and dose if known)   prasugrel  (EFFIENT ) 10 MG TABS tablet   2. Would you like to learn more about the convenience, safety, & potential cost savings by using the [facility]?   3. Are you open to using the [facility] (Type [facility]. ).  4. Which pharmacy/location (including street and city if local pharmacy) is medication to be sent to?  [id number] - [place], [place] - [address]   5. Do they need a 30 day or 90 day supply?   Patient stated he is completely out of this medication.  Patient has appointment scheduled with Dr. Lavona on 4/22. "

## 2024-10-27 NOTE — Telephone Encounter (Signed)
 Rx sent.

## 2025-01-18 ENCOUNTER — Ambulatory Visit: Admitting: Cardiology
